# Patient Record
Sex: Male | Born: 1955 | Race: Black or African American | Hispanic: No | Marital: Single | Smoking: Current every day smoker
Health system: Southern US, Community
[De-identification: ages and names within clinical notes are randomized; demographics above are authoritative.]

## PROBLEM LIST (undated history)

## (undated) DIAGNOSIS — Z8719 Personal history of other diseases of the digestive system: Secondary | ICD-10-CM

## (undated) DIAGNOSIS — K7460 Unspecified cirrhosis of liver: Secondary | ICD-10-CM

## (undated) DIAGNOSIS — Z8619 Personal history of other infectious and parasitic diseases: Secondary | ICD-10-CM

## (undated) DIAGNOSIS — F1410 Cocaine abuse, uncomplicated: Secondary | ICD-10-CM

## (undated) DIAGNOSIS — B0229 Other postherpetic nervous system involvement: Secondary | ICD-10-CM

## (undated) DIAGNOSIS — D179 Benign lipomatous neoplasm, unspecified: Secondary | ICD-10-CM

## (undated) DIAGNOSIS — I10 Essential (primary) hypertension: Secondary | ICD-10-CM

## (undated) DIAGNOSIS — F1020 Alcohol dependence, uncomplicated: Secondary | ICD-10-CM

## (undated) DIAGNOSIS — Z21 Asymptomatic human immunodeficiency virus [HIV] infection status: Secondary | ICD-10-CM

## (undated) DIAGNOSIS — K219 Gastro-esophageal reflux disease without esophagitis: Secondary | ICD-10-CM

## (undated) DIAGNOSIS — Z8615 Personal history of latent tuberculosis infection: Secondary | ICD-10-CM

## (undated) DIAGNOSIS — B20 Human immunodeficiency virus [HIV] disease: Secondary | ICD-10-CM

## (undated) HISTORY — PX: OTHER SURGICAL HISTORY: SHX169

---

## 2006-08-28 ENCOUNTER — Emergency Department: Payer: Self-pay | Admitting: Emergency Medicine

## 2012-06-10 ENCOUNTER — Emergency Department: Payer: Self-pay | Admitting: Emergency Medicine

## 2014-06-16 ENCOUNTER — Emergency Department: Payer: Self-pay | Admitting: Emergency Medicine

## 2014-06-16 LAB — URINALYSIS, COMPLETE
Bacteria: NONE SEEN
Bilirubin,UR: NEGATIVE
GLUCOSE, UR: NEGATIVE mg/dL (ref 0–75)
Ketone: NEGATIVE
Leukocyte Esterase: NEGATIVE
NITRITE: NEGATIVE
Ph: 5 (ref 4.5–8.0)
Protein: 30
RBC,UR: 1 /HPF (ref 0–5)
SQUAMOUS EPITHELIAL: NONE SEEN
Specific Gravity: 1.015 (ref 1.003–1.030)

## 2014-06-16 LAB — CBC
HCT: 41.2 % (ref 40.0–52.0)
HGB: 13.6 g/dL (ref 13.0–18.0)
MCH: 31.7 pg (ref 26.0–34.0)
MCHC: 33.1 g/dL (ref 32.0–36.0)
MCV: 96 fL (ref 80–100)
Platelet: 218 10*3/uL (ref 150–440)
RBC: 4.3 10*6/uL — AB (ref 4.40–5.90)
RDW: 13.1 % (ref 11.5–14.5)
WBC: 5.5 10*3/uL (ref 3.8–10.6)

## 2014-06-16 LAB — COMPREHENSIVE METABOLIC PANEL
ALBUMIN: 4 g/dL (ref 3.4–5.0)
ANION GAP: 5 — AB (ref 7–16)
Alkaline Phosphatase: 69 U/L
BUN: 14 mg/dL (ref 7–18)
Bilirubin,Total: 2 mg/dL — ABNORMAL HIGH (ref 0.2–1.0)
CHLORIDE: 103 mmol/L (ref 98–107)
CO2: 29 mmol/L (ref 21–32)
Calcium, Total: 9 mg/dL (ref 8.5–10.1)
Creatinine: 1.41 mg/dL — ABNORMAL HIGH (ref 0.60–1.30)
EGFR (African American): >60
EGFR (Non-African Amer.): 55 — ABNORMAL LOW
GLUCOSE: 88 mg/dL (ref 65–99)
OSMOLALITY: 274 (ref 275–301)
Potassium: 3.7 mmol/L (ref 3.5–5.1)
SGOT(AST): 26 U/L (ref 15–37)
SGPT (ALT): 27 U/L (ref 12–78)
SODIUM: 137 mmol/L (ref 136–145)
Total Protein: 7.8 g/dL (ref 6.4–8.2)

## 2016-08-27 ENCOUNTER — Emergency Department
Admission: EM | Admit: 2016-08-27 | Discharge: 2016-08-27 | Disposition: A | Discharge status: Home / Self Care | Payer: Commercial Managed Care - HMO | Attending: Emergency Medicine | Admitting: Emergency Medicine

## 2016-08-27 ENCOUNTER — Encounter: Payer: Self-pay | Admitting: Emergency Medicine

## 2016-08-27 DIAGNOSIS — T2641XA Burn of right eye and adnexa, part unspecified, initial encounter: Secondary | ICD-10-CM

## 2016-08-27 DIAGNOSIS — X088XXA Exposure to other specified smoke, fire and flames, initial encounter: Secondary | ICD-10-CM | POA: Insufficient documentation

## 2016-08-27 DIAGNOSIS — Y9389 Activity, other specified: Secondary | ICD-10-CM | POA: Insufficient documentation

## 2016-08-27 DIAGNOSIS — Y999 Unspecified external cause status: Secondary | ICD-10-CM | POA: Diagnosis not present

## 2016-08-27 DIAGNOSIS — F17200 Nicotine dependence, unspecified, uncomplicated: Secondary | ICD-10-CM | POA: Insufficient documentation

## 2016-08-27 DIAGNOSIS — T2611XA Burn of cornea and conjunctival sac, right eye, initial encounter: Secondary | ICD-10-CM | POA: Insufficient documentation

## 2016-08-27 DIAGNOSIS — Z21 Asymptomatic human immunodeficiency virus [HIV] infection status: Secondary | ICD-10-CM | POA: Diagnosis not present

## 2016-08-27 DIAGNOSIS — Y929 Unspecified place or not applicable: Secondary | ICD-10-CM | POA: Diagnosis not present

## 2016-08-27 DIAGNOSIS — H5711 Ocular pain, right eye: Secondary | ICD-10-CM | POA: Diagnosis present

## 2016-08-27 DIAGNOSIS — I10 Essential (primary) hypertension: Secondary | ICD-10-CM | POA: Diagnosis not present

## 2016-08-27 HISTORY — DX: Asymptomatic human immunodeficiency virus (hiv) infection status: Z21

## 2016-08-27 HISTORY — DX: Essential (primary) hypertension: I10

## 2016-08-27 HISTORY — DX: Human immunodeficiency virus (HIV) disease: B20

## 2016-08-27 MED ORDER — FLUORESCEIN SODIUM 1 MG OP STRP
1.0000 | ORAL_STRIP | Freq: Once | OPHTHALMIC | Status: AC
  Administered 2016-08-27: 1 via OPHTHALMIC
  Filled 2016-08-27: qty 1

## 2016-08-27 MED ORDER — TETRACAINE HCL 0.5 % OP SOLN
2.0000 [drp] | Freq: Once | OPHTHALMIC | Status: AC
  Administered 2016-08-27: 2 [drp] via OPHTHALMIC
  Filled 2016-08-27: qty 2

## 2016-08-27 MED ORDER — KETOROLAC TROMETHAMINE 0.5 % OP SOLN: 1.0000 [drp] | Freq: Four times a day (QID) | OPHTHALMIC | 0 refills | Status: DC
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
## 2018-03-29 HISTORY — PX: COLONOSCOPY: SHX174

## 2018-06-12 ENCOUNTER — Emergency Department: Payer: Medicare HMO

## 2018-06-12 ENCOUNTER — Emergency Department: Payer: Medicare HMO | Admitting: Certified Registered"

## 2018-06-12 ENCOUNTER — Encounter
Admission: EM | Disposition: A | Discharge status: Home / Self Care | Payer: Self-pay | Source: Home / Self Care | Attending: Emergency Medicine

## 2018-06-12 ENCOUNTER — Other Ambulatory Visit: Payer: Self-pay

## 2018-06-12 ENCOUNTER — Emergency Department
Admission: EM | Admit: 2018-06-12 | Discharge: 2018-06-12 | Disposition: A | Discharge status: Home / Self Care | Payer: Medicare HMO | Attending: Gastroenterology | Admitting: Gastroenterology

## 2018-06-12 ENCOUNTER — Encounter: Payer: Self-pay | Admitting: *Deleted

## 2018-06-12 DIAGNOSIS — Z8719 Personal history of other diseases of the digestive system: Secondary | ICD-10-CM | POA: Insufficient documentation

## 2018-06-12 DIAGNOSIS — I10 Essential (primary) hypertension: Secondary | ICD-10-CM | POA: Insufficient documentation

## 2018-06-12 DIAGNOSIS — F17200 Nicotine dependence, unspecified, uncomplicated: Secondary | ICD-10-CM | POA: Diagnosis not present

## 2018-06-12 DIAGNOSIS — T18108A Unspecified foreign body in esophagus causing other injury, initial encounter: Secondary | ICD-10-CM | POA: Diagnosis not present

## 2018-06-12 DIAGNOSIS — Z79899 Other long term (current) drug therapy: Secondary | ICD-10-CM | POA: Diagnosis not present

## 2018-06-12 DIAGNOSIS — T18128A Food in esophagus causing other injury, initial encounter: Secondary | ICD-10-CM | POA: Diagnosis not present

## 2018-06-12 DIAGNOSIS — X58XXXA Exposure to other specified factors, initial encounter: Secondary | ICD-10-CM | POA: Insufficient documentation

## 2018-06-12 DIAGNOSIS — K209 Esophagitis, unspecified: Secondary | ICD-10-CM | POA: Diagnosis not present

## 2018-06-12 DIAGNOSIS — Z21 Asymptomatic human immunodeficiency virus [HIV] infection status: Secondary | ICD-10-CM | POA: Diagnosis not present

## 2018-06-12 HISTORY — PX: FOREIGN BODY REMOVAL: SHX962

## 2018-06-12 LAB — BASIC METABOLIC PANEL
ANION GAP: 15 (ref 5–15)
BUN: 15 mg/dL (ref 6–20)
CHLORIDE: 102 mmol/L (ref 101–111)
CO2: 24 mmol/L (ref 22–32)
Calcium: 9.3 mg/dL (ref 8.9–10.3)
Creatinine, Ser: 1.42 mg/dL — ABNORMAL HIGH (ref 0.61–1.24)
GFR calc Af Amer: >60 mL/min (ref >60)
GFR, EST NON AFRICAN AMERICAN: 52 mL/min — AB (ref >60)
Glucose, Bld: 102 mg/dL — ABNORMAL HIGH (ref 65–99)
POTASSIUM: 3.8 mmol/L (ref 3.5–5.1)
Sodium: 141 mmol/L (ref 135–145)

## 2018-06-12 LAB — CBC WITH DIFFERENTIAL/PLATELET
BASOS ABS: 0.1 10*3/uL (ref 0–0.1)
Basophils Relative: 1 %
Eosinophils Absolute: 0.1 10*3/uL (ref 0–0.7)
Eosinophils Relative: 1 %
HEMATOCRIT: 41.6 % (ref 40.0–52.0)
HEMOGLOBIN: 14.3 g/dL (ref 13.0–18.0)
LYMPHS ABS: 2.3 10*3/uL (ref 1.0–3.6)
LYMPHS PCT: 24 %
MCH: 31.5 pg (ref 26.0–34.0)
MCHC: 34.4 g/dL (ref 32.0–36.0)
MCV: 91.6 fL (ref 80.0–100.0)
Monocytes Absolute: 0.8 10*3/uL (ref 0.2–1.0)
Monocytes Relative: 8 %
NEUTROS ABS: 6.3 10*3/uL (ref 1.4–6.5)
Neutrophils Relative %: 66 %
Platelets: 301 10*3/uL (ref 150–440)
RBC: 4.55 MIL/uL (ref 4.40–5.90)
RDW: 14.1 % (ref 11.5–14.5)
WBC: 9.4 10*3/uL (ref 3.8–10.6)

## 2018-06-12 SURGERY — REMOVAL, FOREIGN BODY
Anesthesia: General

## 2018-06-12 MED ORDER — SUCCINYLCHOLINE CHLORIDE 20 MG/ML IJ SOLN
INTRAMUSCULAR | Status: AC
  Filled 2018-06-12: qty 1

## 2018-06-12 MED ORDER — FENTANYL CITRATE (PF) 100 MCG/2ML IJ SOLN
INTRAMUSCULAR | Status: AC
  Filled 2018-06-12: qty 2

## 2018-06-12 MED ORDER — PHENYLEPHRINE HCL 10 MG/ML IJ SOLN
INTRAMUSCULAR | Status: DC | PRN
  Administered 2018-06-12 (×3): 200 ug via INTRAVENOUS

## 2018-06-12 MED ORDER — ONDANSETRON HCL 4 MG/2ML IJ SOLN
4.0000 mg | INTRAMUSCULAR | Status: AC
  Administered 2018-06-12: 4 mg via INTRAVENOUS

## 2018-06-12 MED ORDER — FENTANYL CITRATE (PF) 100 MCG/2ML IJ SOLN
INTRAMUSCULAR | Status: DC | PRN
  Administered 2018-06-12 (×2): 50 ug via INTRAVENOUS

## 2018-06-12 MED ORDER — GLUCAGON HCL RDNA (DIAGNOSTIC) 1 MG IJ SOLR
INTRAMUSCULAR | Status: DC | PRN
  Administered 2018-06-12: 1 mg via INTRAVENOUS

## 2018-06-12 MED ORDER — GI COCKTAIL ~~LOC~~
30.0000 mL | Freq: Once | ORAL | Status: AC
  Administered 2018-06-12: 30 mL via ORAL

## 2018-06-12 MED ORDER — LIDOCAINE HCL (PF) 2 % IJ SOLN
INTRAMUSCULAR | Status: AC
  Filled 2018-06-12: qty 10

## 2018-06-12 MED ORDER — ONDANSETRON HCL 4 MG/2ML IJ SOLN
INTRAMUSCULAR | Status: AC
  Filled 2018-06-12: qty 2

## 2018-06-12 MED ORDER — GLYCOPYRROLATE 0.2 MG/ML IJ SOLN
INTRAMUSCULAR | Status: DC | PRN
  Administered 2018-06-12: 0.2 mg via INTRAVENOUS

## 2018-06-12 MED ORDER — DEXAMETHASONE SODIUM PHOSPHATE 10 MG/ML IJ SOLN
INTRAMUSCULAR | Status: AC
  Filled 2018-06-12: qty 1

## 2018-06-12 MED ORDER — MIDAZOLAM HCL 5 MG/5ML IJ SOLN
INTRAMUSCULAR | Status: DC | PRN
  Administered 2018-06-12: 2 mg via INTRAVENOUS

## 2018-06-12 MED ORDER — ONDANSETRON HCL 4 MG/2ML IJ SOLN: 4.0000 mg | Freq: Once | INTRAMUSCULAR | Status: DC | PRN

## 2018-06-12 MED ORDER — MIDAZOLAM HCL 2 MG/2ML IJ SOLN
INTRAMUSCULAR | Status: AC
  Filled 2018-06-12: qty 2

## 2018-06-12 MED ORDER — GLUCAGON HCL RDNA (DIAGNOSTIC) 1 MG IJ SOLR
INTRAMUSCULAR | Status: AC
  Filled 2018-06-12: qty 1

## 2018-06-12 MED ORDER — SUCCINYLCHOLINE CHLORIDE 20 MG/ML IJ SOLN
INTRAMUSCULAR | Status: DC | PRN
  Administered 2018-06-12: 160 mg via INTRAVENOUS

## 2018-06-12 MED ORDER — ROCURONIUM BROMIDE 100 MG/10ML IV SOLN
INTRAVENOUS | Status: DC | PRN
  Administered 2018-06-12: 5 mg via INTRAVENOUS

## 2018-06-12 MED ORDER — SODIUM CHLORIDE 0.9 % IV SOLN
INTRAVENOUS | Status: DC | PRN
  Administered 2018-06-12 (×2): via INTRAVENOUS

## 2018-06-12 MED ORDER — PROPOFOL 10 MG/ML IV BOLUS
INTRAVENOUS | Status: DC | PRN
  Administered 2018-06-12: 180 mg via INTRAVENOUS
  Administered 2018-06-12: 120 mg via INTRAVENOUS

## 2018-06-12 MED ORDER — DEXAMETHASONE SODIUM PHOSPHATE 10 MG/ML IJ SOLN
INTRAMUSCULAR | Status: DC | PRN
  Administered 2018-06-12: 10 mg via INTRAVENOUS

## 2018-06-12 MED ORDER — PROPOFOL 10 MG/ML IV BOLUS
INTRAVENOUS | Status: AC
  Filled 2018-06-12: qty 20

## 2018-06-12 MED ORDER — ROCURONIUM BROMIDE 50 MG/5ML IV SOLN
INTRAVENOUS | Status: AC
  Filled 2018-06-12: qty 1

## 2018-06-12 MED ORDER — GI COCKTAIL ~~LOC~~
ORAL | Status: AC
  Filled 2018-06-12: qty 30

## 2018-06-12 MED ORDER — GLYCOPYRROLATE 0.2 MG/ML IJ SOLN
INTRAMUSCULAR | Status: AC
  Filled 2018-06-12: qty 1

## 2018-06-12 MED ORDER — ONDANSETRON HCL 4 MG/2ML IJ SOLN
INTRAMUSCULAR | Status: DC | PRN
  Administered 2018-06-12: 4 mg via INTRAVENOUS

## 2018-06-12 MED ORDER — LIDOCAINE HCL (CARDIAC) PF 100 MG/5ML IV SOSY
PREFILLED_SYRINGE | INTRAVENOUS | Status: DC | PRN
  Administered 2018-06-12: 60 mg via INTRATRACHEAL

## 2018-06-12 MED ORDER — FENTANYL CITRATE (PF) 100 MCG/2ML IJ SOLN: 25.0000 ug | INTRAMUSCULAR | Status: DC | PRN

## 2018-06-12 MED ORDER — SODIUM CHLORIDE 0.9 % IV BOLUS
1000.0000 mL | INTRAVENOUS | Status: AC
Start: 2018-06-12 — End: 2018-06-12
  Administered 2018-06-12: 1000 mL via INTRAVENOUS

## 2018-06-12 NOTE — Anesthesia Postprocedure Evaluation (Signed)
Anesthesia Post Note  Patient: Richard Rowe  Procedure(s) Performed: FOREIGN BODY REMOVAL (N/A )  Anesthesia Type: General     Last Vitals:  Vitals:   06/12/18 0808 06/12/18 0809  BP: (!) 152/86   Pulse: (!) 109 (!) 105  Resp: (!) 21 20  Temp: 36.6 C   SpO2: 99% 97%    Last Pain:  Vitals:   06/12/18 0809  TempSrc: Tympanic  PainSc:                  Sephira Zellman L Martin Smeal

## 2018-06-12 NOTE — Transfer of Care (Signed)
Immediate Anesthesia Transfer of Care Note  Patient: Richard Rowe  Procedure(s) Performed: FOREIGN BODY REMOVAL (N/A )  Patient Location: PACU  Anesthesia Type:General  Level of Consciousness: awake, alert  and oriented  Airway & Oxygen Therapy: Patient Spontanous Breathing and Patient connected to nasal cannula oxygen  Post-op Assessment: Report given to RN, Post -op Vital signs reviewed and stable and Patient moving all extremities  Post vital signs: Reviewed and stable  Last Vitals:  Vitals Value Taken Time  BP 152/86 06/12/2018  8:08 AM  Temp 36.6 C 06/12/2018  8:08 AM  Pulse 107 06/12/2018  8:09 AM  Resp 21 06/12/2018  8:09 AM  SpO2 96 % 06/12/2018  8:09 AM  Vitals shown include unvalidated device data.  Last Pain:  Vitals:   06/12/18 0809  TempSrc: Tympanic  PainSc:          Complications: No apparent anesthesia complications

## 2018-06-12 NOTE — H&P (Signed)
[name], MD [address] [place], [place], [place], [address] [address], [place], [place], [place], [address] Phone: [phone]  Fax: [phone]  Primary Care Physician:  Patient, No Pcp Per   Pre-Procedure History & Physical: HPI:  Richard Rowe is a 62 y.o. male is here for an endoscopy    Past Medical History:  Diagnosis Date  . HIV (human immunodeficiency virus infection) ([place])   . Hypertension     Past Surgical History:  Procedure Laterality Date  . rectal polyps      Prior to Admission medications   Medication Sig Start Date End Date Taking? Authorizing Provider  BIKTARVY 50-200-25 MG TABS tablet Take 1 tablet by mouth daily.   Yes [provider]  enalapril (VASOTEC) 20 MG tablet Take 20 mg by mouth daily.   Yes [provider]  gabapentin (NEURONTIN) 600 MG tablet Take 600 mg by mouth 3 (three) times daily.   Yes [provider]  ketorolac (ACULAR) 0.5 % ophthalmic solution Place 1 drop into the right eye 4 (four) times daily. Patient not taking: Reported on 06/12/2018 08/27/16   Cuthriell, Charline Bills, PA-C    Allergies as of 06/12/2018  . (No Known Allergies)    History reviewed. No pertinent family history.  Social History   Socioeconomic History  . Marital status: Single    Spouse name: Not on file  . Number of children: Not on file  . Years of education: Not on file  . Highest education level: Not on file  Occupational History  . Not on file  Social Needs  . Financial resource strain: Not on file  . Food insecurity:    Worry: Not on file    Inability: Not on file  . Transportation needs:    Medical: Not on file    Non-medical: Not on file  Tobacco Use  . Smoking status: Current Some Day Smoker  . Smokeless tobacco: Never Used  Substance and Sexual Activity  . Alcohol use: Yes  . Drug use: Yes    Types: Cocaine    Comment: smoked crack yesterday per pt report  . Sexual activity: Not on file    Lifestyle  . Physical activity:    Days per week: Not on file    Minutes per session: Not on file  . Stress: Not on file  Relationships  . Social connections:    Talks on phone: Not on file    Gets together: Not on file    Attends religious service: Not on file    Active member of club or organization: Not on file    Attends meetings of clubs or organizations: Not on file    Relationship status: Not on file  . Intimate partner violence:    Fear of current or ex partner: Not on file    Emotionally abused: Not on file    Physically abused: Not on file    Forced sexual activity: Not on file  Other Topics Concern  . Not on file  Social History Narrative  . Not on file    Review of Systems: See HPI, otherwise negative ROS  Physical Exam: BP (!) 166/88 (BP Location: Right Arm)   Pulse 87   Temp 98.3 F (36.8 C) (Oral)   Resp 20   Ht 5\' 6"  (1.676 m)   Wt 205 lb (93 kg)   SpO2 99%   BMI 33.09 kg/m  General:   Alert,  pleasant and  cooperative in NAD Head:  Normocephalic and atraumatic. Neck:  Supple; no masses or thyromegaly. Lungs:  Clear throughout to auscultation, normal respiratory effort.    Heart:  +S1, +S2, Regular rate and rhythm, No edema. Abdomen:  Soft, nontender and nondistended. Normal bowel sounds, without guarding, and without rebound.   Neurologic:  Alert and  oriented x4;  grossly normal neurologically.  Impression/Plan: Richard Rowe is here for an endoscopy  to be performed for  evaluation of foreign body in esophagus    Risks, benefits, limitations, and alternatives regarding endoscopy have been reviewed with the patient.  Questions have been answered.  All parties agreeable.   [name], MD  06/12/2018, 7:07 AM

## 2018-06-12 NOTE — ED Triage Notes (Signed)
Pt arrives via EMS from home. He was eating ribs around 2330 and feels like it is caught in his throat, reports when he drinks water it does not go down, starts choking him and comes back up. He is handling his secretions, occasional cough. He is sticking his finger down his throat while in triage causing him to dry heave.  States that he has had this happen before, but when he got to the hospital he was able to vomit it back up. En eroute, pressure 173/107, saturations 100%.

## 2018-06-12 NOTE — ED Provider Notes (Signed)
[facility] Emergency Department Provider Note  ____________________________________________   First MD Initiated Contact with Patient 06/12/18 0250     (approximate)  I have reviewed the triage vital signs and the nursing notes.   HISTORY  Chief Complaint Foreign Body    HPI Richard Rowe is a 62 y.o. male with PMH as listed below who presents for evaluation of several hours of esophageal foreign body.  Reports he was eating ribs tonight when he became choked on a piece of meat, fat, or bone.  He was not able to cough or vomit it out.  Had a similar episode occur years ago which he was able to vomit back up, but his symptoms have been constant for several hours.  He is sitting up, breathing comfortably,  but cannot swallow his secretions, and is repeatedly vomiting and spitting into an emesis bag.  Reports symptoms were acute in onset and severe.  Nothing makes better nor worse.  Denies chest pain, SOB, abdominal pain, dysuria.  Denies fever/chills. Minimal neck/throat pain, but highly uncomfortable. Reports hx of HIV, but reports compliance with anti-retrovirals, and reports his last CD4 count was "good".  Past Medical History:  Diagnosis Date  . HIV (human immunodeficiency virus infection) ([place])   . Hypertension     There are no active problems to display for this patient.   Past Surgical History:  Procedure Laterality Date  . rectal polyps      Prior to Admission medications   Medication Sig Start Date End Date Taking? Authorizing Provider  BIKTARVY 50-200-25 MG TABS tablet Take 1 tablet by mouth daily.   Yes [provider]  enalapril (VASOTEC) 20 MG tablet Take 20 mg by mouth daily.   Yes [provider]  gabapentin (NEURONTIN) 600 MG tablet Take 600 mg by mouth 3 (three) times daily.   Yes [provider]  ketorolac (ACULAR) 0.5 % ophthalmic solution Place 1 drop into the right eye 4 (four) times  daily. Patient not taking: Reported on 06/12/2018 08/27/16   Cuthriell, Charline Bills, PA-C    Allergies Patient has no known allergies.  History reviewed. No pertinent family history.  Social History Social History   Tobacco Use  . Smoking status: Current Some Day Smoker  . Smokeless tobacco: Never Used  Substance Use Topics  . Alcohol use: Yes  . Drug use: Yes    Types: Cocaine    Comment: smoked crack yesterday per pt report    Review of Systems Constitutional: No fever/chills Eyes: No visual changes. ENT: Foreign body sensation in throat, unable to handle secretions Cardiovascular: Denies chest pain. Respiratory: Denies shortness of breath. Gastrointestinal: No abdominal pain.  No nausea, no vomiting.  No diarrhea.  No constipation. Genitourinary: Negative for dysuria. Musculoskeletal: Negative for back pain. Integumentary: Negative for rash. Neurological: Negative for headaches, focal weakness or numbness.   ____________________________________________   PHYSICAL EXAM:  VITAL SIGNS: ED Triage Vitals  Enc Vitals Group     BP 06/12/18 0057 (!) 158/86     Pulse Rate 06/12/18 0057 82     Resp 06/12/18 0057 20     Temp 06/12/18 0057 98.8 F (37.1 C)     Temp Source 06/12/18 0057 Oral     SpO2 06/12/18 0057 98 %     Weight 06/12/18 0057 93 kg (205 lb)     Height 06/12/18 0057 1.676 m (5\' 6" )     Head Circumference --      Peak Flow --  Pain Score 06/12/18 0056 2     Pain Loc --      Pain Edu? --      Excl. in [place]? --     Constitutional: Alert and oriented. Appears acutely uncomfortable and is sitting up in bed and leaning forward with an emesis bag. Eyes: Conjunctivae are normal.  Head: Atraumatic. Nose: No congestion/rhinnorhea. Mouth/Throat: Mucous membranes are moist.  Oropharynx non-erythematous. Neck: No stridor.  Not handling secretions. Normal voice. Cardiovascular: Normal rate, regular rhythm. Good peripheral circulation. Grossly normal heart  sounds. Respiratory: Normal respiratory effort.  No retractions. Lungs CTAB. Gastrointestinal: Soft and nontender. No distention.  Frequent retching/vomiting Musculoskeletal: No lower extremity tenderness nor edema. No gross deformities of extremities. Neurologic:  Normal speech and language. No gross focal neurologic deficits are appreciated.  Skin:  Skin is warm, dry and intact. No rash noted. Psychiatric: Mood and affect are normal. Speech and behavior are normal.  ____________________________________________   LABS (all labs ordered are listed, but only abnormal results are displayed)  Labs Reviewed  BASIC METABOLIC PANEL - Abnormal; Notable for the following components:      Result Value   Glucose, Bld 102 (*)    Creatinine, Ser 1.42 (*)    GFR calc non Af Amer 52 (*)    All other components within normal limits  CBC WITH DIFFERENTIAL/PLATELET   ____________________________________________  EKG  None - EKG not ordered by ED physician patient  ____________________________________________  RADIOLOGY Ursula Alert, personally viewed and evaluated these images (plain radiographs) as part of my medical decision making, as well as reviewing the written report by the radiologist.  ED MD interpretation: Probable foreign body in the esophagus  Official radiology report(s): Dg Neck Soft Tissue  Result Date: 06/12/2018 CLINICAL DATA:  Foreign body sensation after eating ribs. EXAM: NECK SOFT TISSUES - 1+ VIEW COMPARISON:  None. FINDINGS: Probable calcified focus behind the cricoid cartilage at the level of C4-C5 suspicious for bone fragment. No soft tissue air to suggest perforation. No prevertebral soft tissue edema. The epiglottis is normal. No prevertebral soft tissue edema. Multiple missing teeth. IMPRESSION: Findings suspicious for foreign bone fragment in the esophagus at the level of C4-C5. Electronically Signed   By: Jeb Levering M.D.   On: 06/12/2018 02:14     ____________________________________________   PROCEDURES  Critical Care performed: No   Procedure(s) performed:   Procedures   ____________________________________________   INITIAL IMPRESSION / ASSESSMENT AND PLAN / ED COURSE  As part of my medical decision making, I reviewed the following data within the [place] notes reviewed and incorporated, Labs reviewed , Radiograph reviewed  and A consult was requested and obtained from this/these consultant(s) Gastroenterology    Differential diagnosis includes, but is not limited to, esophageal body (either bone or meat), esophageal perforation, abscess/infection.  Based on the history of present illness and the patient's current presentation I strongly feel he has an esophageal foreign body.  His airway is intact and well protected, but he is not able to handle his own secretions.  I had him sit up a little bit of cartilage which he admitted back up.  I also had him try a little bit of a GI cocktail to see if that would tooth and anesthetize his throat, but he also vomited that back up.  I will call gastroenterology for urgent endoscopy and foreign body removal.  Also giving Zofran 4 mg IV.  Clinical Course as of Jun 12 937  Sat  Jun 12, 2018  0301 Spoke with Dr. Vicente Males, discussed the case.  He requested that I call ENT because he felt that this may be more appropriate given the level (C4-C5).   [CF]  [phone] Spoke with Dr. Tami Ribas who felt that given that the bone is in the esophagus, it is more appropriate for GI to handle.  I called Dr. Vicente Males back and he said that he would notify the OR and they would take the patient when possible.   [CF]  9604 Generally reassuring labs.  Still awaiting OR time.   [CF]  5409 Given the persistent vomiting, inability to take oral fluids, and the delay in going to the operating room, I have ordered a 1 L normal saline fluid bolus.   [CF]  8119 No word back from the OR.  Tried  to call Dr. Vicente Males on his mobile phone but no answer.  Left a voicemail.   [CF]  [phone] Dr. Vicente Males called back, let me know that there had been some sort of miscommunication, but he was on his way in, hopefully by 7am.   [CF]    Clinical Course User Index [CF] Hinda Kehr, MD    ____________________________________________  FINAL CLINICAL IMPRESSION(S) / ED DIAGNOSES  Final diagnoses:  Esophageal foreign body, initial encounter     MEDICATIONS GIVEN DURING THIS VISIT:  Medications  fentaNYL (SUBLIMAZE) injection 25 mcg (has no administration in time range)  ondansetron (ZOFRAN) injection 4 mg (has no administration in time range)  gi cocktail (Maalox,Lidocaine,Donnatal) (30 mLs Oral Given 06/12/18 0252)  ondansetron (ZOFRAN) injection 4 mg (4 mg Intravenous Given 06/12/18 0439)  sodium chloride 0.9 % bolus 1,000 mL (1,000 mLs Intravenous New Bag/Given 06/12/18 0545)  ondansetron (ZOFRAN) injection 4 mg (4 mg Intravenous Given 06/12/18 2956)         Note:  This document was prepared using Dragon voice recognition software and may include unintentional dictation errors.    Hinda Kehr, MD 06/12/18 [phone]

## 2018-06-12 NOTE — Anesthesia Procedure Notes (Signed)
Procedure Name: Intubation Date/Time: 06/12/2018 7:15 AM Performed by: Sherrine Maples, CRNA Pre-anesthesia Checklist: Patient identified, Emergency Drugs available, Suction available, Patient being monitored and Timeout performed Patient Re-evaluated:Patient Re-evaluated prior to induction Oxygen Delivery Method: Circle system utilized Preoxygenation: Pre-oxygenation with 100% oxygen Induction Type: IV induction, Rapid sequence and Cricoid Pressure applied Ventilation: Mask ventilation without difficulty Laryngoscope Size: Mac and 3 Grade View: Grade I Tube type: Oral Tube size: 7.0 mm Number of attempts: 1 Airway Equipment and Method: Stylet and Video-laryngoscopy Placement Confirmation: ETT inserted through vocal cords under direct vision,  positive ETCO2 and breath sounds checked- equal and bilateral Secured at: 22 cm Tube secured with: Tape Dental Injury: Teeth and Oropharynx as per pre-operative assessment

## 2018-06-12 NOTE — Anesthesia Post-op Follow-up Note (Signed)
Anesthesia QCDR form completed.        

## 2018-06-12 NOTE — Progress Notes (Signed)
Marsha  RN from endo called patients sister To come and pick him up for discharge.

## 2018-06-12 NOTE — Anesthesia Preprocedure Evaluation (Signed)
Anesthesia Evaluation  Patient identified by MRN, date of birth, ID band Patient awake    Reviewed: Allergy & Precautions, H&P , NPO status , Patient's Chart, lab work & pertinent test results, reviewed documented beta blocker date and time   Airway Mallampati: II   Neck ROM: full    Dental  (+) Poor Dentition   Pulmonary neg pulmonary ROS, Current Smoker,    Pulmonary exam normal        Cardiovascular Exercise Tolerance: Good hypertension, On Medications negative cardio ROS Normal cardiovascular exam Rhythm:regular Rate:Normal     Neuro/Psych negative neurological ROS  negative psych ROS   GI/Hepatic negative GI ROS, Neg liver ROS,   Endo/Other  negative endocrine ROS  Renal/GU negative Renal ROS  negative genitourinary   Musculoskeletal   Abdominal   Peds  Hematology negative hematology ROS (+)   Anesthesia Other Findings Past Medical History: No date: HIV (human immunodeficiency virus infection) ([facility]) No date: Hypertension Past Surgical History: No date: rectal polyps BMI    Body Mass Index:  33.09 kg/m     Reproductive/Obstetrics negative OB ROS                             Anesthesia Physical Anesthesia Plan  ASA: III and emergent  Anesthesia Plan: General   Post-op Pain Management:    Induction:   PONV Risk Score and Plan:   Airway Management Planned:   Additional Equipment:   Intra-op Plan:   Post-operative Plan:   Informed Consent: I have reviewed the patients History and Physical, chart, labs and discussed the procedure including the risks, benefits and alternatives for the proposed anesthesia with the patient or authorized representative who has indicated his/her understanding and acceptance.   Dental Advisory Given  Plan Discussed with: CRNA  Anesthesia Plan Comments:         Anesthesia Quick Evaluation

## 2018-06-12 NOTE — Consult Note (Signed)
Richard Rowe , MD [address], [place], [place], [place], [address] [address], [place], [place], [place], [address] Phone: [phone]  Fax: [phone]  Consultation  Referring Provider:   Dr Karma Greaser Primary Care Physician:  Patient, No Pcp Per Primary Gastroenterologist:  None         Reason for Consultation:     Foreign body   Date of Admission:  06/12/2018 Date of Consultation:  06/12/2018         HPI:   Richard Rowe is a 62 y.o. male with a history of HIV, at 11.30 pm was eating beef rib and thought a piece of bone was stuck in his throat. Can still feel it lodged. Similar issue many years back. Not on any blood thinners.   X ray neck shows at level of C4-C5, I discussed with ENT Dr Tami Ribas if it would warrant ENT to try as it is is higher up than the origin of the UES, he suggested we try and see if possible to take out.   Past Medical History:  Diagnosis Date  . HIV (human immunodeficiency virus infection) ([place])   . Hypertension     Past Surgical History:  Procedure Laterality Date  . rectal polyps      Prior to Admission medications   Medication Sig Start Date End Date Taking? Authorizing Provider  BIKTARVY 50-200-25 MG TABS tablet Take 1 tablet by mouth daily.   Yes [provider]  enalapril (VASOTEC) 20 MG tablet Take 20 mg by mouth daily.   Yes [provider]  gabapentin (NEURONTIN) 600 MG tablet Take 600 mg by mouth 3 (three) times daily.   Yes [provider]  ketorolac (ACULAR) 0.5 % ophthalmic solution Place 1 drop into the right eye 4 (four) times daily. Patient not taking: Reported on 06/12/2018 08/27/16   Cuthriell, Charline Bills, PA-C    History reviewed. No pertinent family history.   Social History   Tobacco Use  . Smoking status: Current Some Day Smoker  . Smokeless tobacco: Never Used  Substance Use Topics  . Alcohol use: Yes  . Drug use: Yes    Types: Cocaine    Comment: smoked crack yesterday per pt  report    Allergies as of 06/12/2018  . (No Known Allergies)    Review of Systems:    All systems reviewed and negative except where noted in HPI.   Physical Exam:  Vital signs in last 24 hours: Temp:  [98.3 F (36.8 C)-98.8 F (37.1 C)] 98.3 F (36.8 C) (06/15 0650) Pulse Rate:  [82-87] 87 (06/15 0650) Resp:  [20] 20 (06/15 0650) BP: (158-166)/(86-88) 166/88 (06/15 0650) SpO2:  [98 %-99 %] 99 % (06/15 0650) Weight:  [205 lb (93 kg)] 205 lb (93 kg) (06/15 0057)   General:   Pleasant, cooperative in NAD Head:  Normocephalic and atraumatic. Eyes:   No icterus.   Conjunctiva pink. PERRLA. Ears:  Normal auditory acuity. Neck:  Supple; no masses or thyroidomegaly Lungs: Respirations even and unlabored. Lungs clear to auscultation bilaterally.   No wheezes, crackles, or rhonchi.  Heart:  Regular rate and rhythm;  Without murmur, clicks, rubs or gallops Abdomen:  Soft, nondistended, nontender. Normal bowel sounds. No appreciable masses or hepatomegaly.  No rebound or guarding.  Neurologic:  Alert and oriented x3;  grossly normal neurologically. Skin:  Intact without significant lesions or rashes. Cervical Nodes:  No significant cervical adenopathy. Psych:  Alert and cooperative. Normal affect.  LAB  RESULTS: Recent Labs    06/12/18 0344  WBC 9.4  HGB 14.3  HCT 41.6  PLT 301   BMET Recent Labs    06/12/18 0438  NA 141  K 3.8  CL 102  CO2 24  GLUCOSE 102*  BUN 15  CREATININE 1.42*  CALCIUM 9.3   LFT No results for input(s): PROT, ALBUMIN, AST, ALT, ALKPHOS, BILITOT, BILIDIR, IBILI in the last 72 hours. PT/INR No results for input(s): LABPROT, INR in the last 72 hours.  STUDIES: Dg Neck Soft Tissue  Result Date: 06/12/2018 CLINICAL DATA:  Foreign body sensation after eating ribs. EXAM: NECK SOFT TISSUES - 1+ VIEW COMPARISON:  None. FINDINGS: Probable calcified focus behind the cricoid cartilage at the level of C4-C5 suspicious for bone fragment. No soft tissue  air to suggest perforation. No prevertebral soft tissue edema. The epiglottis is normal. No prevertebral soft tissue edema. Multiple missing teeth. IMPRESSION: Findings suspicious for foreign bone fragment in the esophagus at the level of C4-C5. Electronically Signed   By: Jeb Levering M.D.   On: 06/12/2018 02:14      Impression / Plan:   Richard Rowe is a 62 y.o. y/o male with foreign body in esophagus.   Plan   1. EGD  I have discussed alternative options, risks & benefits,  which include, but are not limited to, bleeding, infection, perforation,respiratory complication & drug reaction.  The patient agrees with this plan & written consent will be obtained.      Thank you for involving me in the care of this patient.      LOS: 0 days   Richard Bellows, MD  06/12/2018, 7:08 AM

## 2018-06-12 NOTE — Anesthesia Postprocedure Evaluation (Signed)
Anesthesia Post Note  Patient: Richard Rowe  Procedure(s) Performed: FOREIGN BODY REMOVAL (N/A )  Patient location during evaluation: PACU Anesthesia Type: General Level of consciousness: awake Pain management: pain level controlled Vital Signs Assessment: post-procedure vital signs reviewed and stable Respiratory status: spontaneous breathing, nonlabored ventilation and patient connected to nasal cannula oxygen Cardiovascular status: blood pressure returned to baseline and stable Postop Assessment: no headache     Last Vitals:  Vitals:   06/12/18 0808 06/12/18 0809  BP: (!) 152/86   Pulse: (!) 109 (!) 105  Resp: (!) 21 20  Temp: 36.6 C   SpO2: 99% 97%    Last Pain:  Vitals:   06/12/18 0809  TempSrc: Tympanic  PainSc:                  [name]

## 2018-06-12 NOTE — Anesthesia Postprocedure Evaluation (Signed)
Anesthesia Post Note  Patient: Richard Rowe  Procedure(s) Performed: FOREIGN BODY REMOVAL (N/A )  Anesthesia Type: General     Last Vitals:  Vitals:   06/12/18 0808 06/12/18 0809  BP: (!) 152/86   Pulse: (!) 109 (!) 105  Resp: (!) 21 20  Temp: 36.6 C   SpO2: 99% 97%    Last Pain:  Vitals:   06/12/18 0809  TempSrc: Tympanic  PainSc:                  Ruba Outen L Rose Hippler

## 2018-06-14 NOTE — Op Note (Addendum)
[facility] Patient Name: Richard Rowe Procedure Date: 06/12/2018 7:12 AM MRN: [id number] Account #: [id number] Date of Birth: [birth year] Admit Type: Outpatient Age: 62 Room: [facility] ENDO ROOM 1 Note Status: Finalized Procedure:            Upper GI endoscopy Indications:          Foreign body in the esophagus Providers:            [name] MD, MD Medicines:            Monitored Anesthesia Care Complications:        No immediate complications. Procedure:            Pre-Anesthesia Assessment:                       - Prior to the procedure, a History and Physical was                        performed, and patient medications, allergies and                        sensitivities were reviewed. The patient's tolerance of                        previous anesthesia was reviewed.                       - The risks and benefits of the procedure and the                        sedation options and risks were discussed with the                        patient. All questions were answered and informed                        consent was obtained.                       - ASA Grade Assessment: III - A patient with severe                        systemic disease.                       After obtaining informed consent, the endoscope was                        passed under direct vision. Throughout the procedure,                        the patient's blood pressure, pulse, and oxygen                        saturations were monitored continuously. The Endoscope                        was introduced through the mouth, and advanced to the                        third part of duodenum. The upper GI endoscopy was  somewhat difficult. Findings:      The examined duodenum was normal.      The stomach was normal.      Food was found in the lower third of the esophagus. Removal of food was       accomplished. Large piece of meat- taken out in pieces     LA Grade B (one or more mucosal breaks greater than 5 mm, not extending       between the tops of two mucosal folds) esophagitis with no bleeding was       found at the gastroesophageal junction.      The cardia and gastric fundus were normal on retroflexion. Impression:           - Normal examined duodenum.                       - Normal stomach.                       - Food in the lower third of the esophagus. Removal was                        successful.                       - LA Grade B esophagitis. Recommendation:       - Discharge patient to home (with escort).                       - Advance diet as tolerated.                       - Continue present medications.                       - Use Zantac (ranitidine) 150 mg PO BID for 6 weeks.                       - Return to my office in 6 weeks. Procedure Code(s):    --- Professional ---                       [phone], Esophagogastroduodenoscopy, flexible, transoral;                        with removal of foreign body(s) Diagnosis Code(s):    --- Professional ---                       O13.086V, Food in esophagus causing other injury,                        initial encounter                       T18.108A, Unspecified foreign body in esophagus causing                        other injury, initial encounter                       K20.9, Esophagitis, unspecified CPT copyright 2017 American Medical Association. All rights reserved. The codes documented in this report are preliminary and upon coder review may  be  revised to meet current compliance requirements. [name], MD [name] MD, MD 06/12/2018 7:58:32 AM This report has been signed electronically. Number of Addenda: 0 Note Initiated On: 06/12/2018 7:12 AM      [facility]

## 2018-06-16 ENCOUNTER — Encounter: Payer: Self-pay | Admitting: Gastroenterology

## 2018-06-29 ENCOUNTER — Encounter: Payer: Self-pay | Admitting: Gastroenterology

## 2019-10-13 DIAGNOSIS — Z23 Encounter for immunization: Secondary | ICD-10-CM | POA: Diagnosis not present

## 2019-10-13 DIAGNOSIS — I10 Essential (primary) hypertension: Secondary | ICD-10-CM | POA: Diagnosis not present

## 2019-10-19 DIAGNOSIS — E781 Pure hyperglyceridemia: Secondary | ICD-10-CM | POA: Diagnosis not present

## 2019-10-19 DIAGNOSIS — E785 Hyperlipidemia, unspecified: Secondary | ICD-10-CM | POA: Diagnosis not present

## 2019-11-22 ENCOUNTER — Ambulatory Visit: Payer: Self-pay | Admitting: Podiatry

## 2019-12-06 ENCOUNTER — Ambulatory Visit: Payer: Self-pay | Admitting: Podiatry

## 2020-10-08 ENCOUNTER — Other Ambulatory Visit: Payer: Self-pay | Admitting: Registered Nurse

## 2020-10-08 DIAGNOSIS — K7460 Unspecified cirrhosis of liver: Secondary | ICD-10-CM

## 2020-10-24 ENCOUNTER — Other Ambulatory Visit: Payer: Self-pay

## 2020-10-24 ENCOUNTER — Ambulatory Visit
Admission: RE | Admit: 2020-10-24 | Discharge: 2020-10-24 | Disposition: A | Discharge status: Home / Self Care | Payer: Medicare Other | Source: Ambulatory Visit | Attending: Registered Nurse | Admitting: Registered Nurse

## 2020-10-24 DIAGNOSIS — K7460 Unspecified cirrhosis of liver: Secondary | ICD-10-CM | POA: Diagnosis not present

## 2021-09-05 ENCOUNTER — Ambulatory Visit: Payer: Self-pay | Admitting: General Surgery

## 2021-09-05 NOTE — H&P (Signed)
REFERRING PHYSICIAN:  Benjamine Sprague, DO   PROVIDER:  Monico Blitz, MD   MRN: [id number] DOB: 1956-07-17 DATE OF ENCOUNTER: 09/05/2021   Subjective    Chief Complaint: skim tag anal     History of Present Illness: Richard Rowe is a 65 y.o. male who is seen today as an office consultation at the request of Dr. Lysle Pearl for evaluation of skim tag anal   Pt with HIV on treatment with undetectable levels, also with h/o liver cirrhosis presents to office for evaluation of a reported positive anal pap test.  He states he has undetectable HIV titers and end stage liver disease.  He also has a lesion on his posterior  L thigh     Review of Systems: A complete review of systems was obtained from the patient.  I have reviewed this information and discussed as appropriate with the patient.  See HPI as well for other ROS.     Medical History: Past Medical History      Past Medical History:  Diagnosis Date   End-stage liver disease (CMS-HCC)          There is no problem list on file for this patient.     Past Surgical History  History reviewed. No pertinent surgical history.      Allergies  No Known Allergies           Current Outpatient Medications on File Prior to Visit  Medication Sig Dispense Refill   b complex vitamins capsule Take by mouth       BIKTARVY 50-200-25 mg tablet Take 1 tablet by mouth once daily       cholecalciferol (VITAMIN D3) 1000 unit tablet Take by mouth       elvitegravir-cobicistat-emtricitabine-tenofovir alafenamide (GENVOYA) 150-150-200-10 mg tablet Take 1 tablet by mouth once daily       enalapril (VASOTEC) 20 MG tablet Take 20 mg by mouth 2 (two) times daily       gabapentin (NEURONTIN) 600 MG tablet TAKE 1 TABLET BY MOUTH THREE TIMES DAILY       rosuvastatin (CRESTOR) 5 MG tablet Take 5 mg by mouth once daily        No current facility-administered medications on file prior to visit.      Family History       Family History   Problem Relation Age of Onset   High blood pressure (Hypertension) Sister     Hyperlipidemia (Elevated cholesterol) Sister     Breast cancer Sister          Social History       Tobacco Use  Smoking Status Current Some Day Smoker  Smokeless Tobacco Never Used      Social History  Social History        Socioeconomic History   Marital status: Single  Tobacco Use   Smoking status: Current Some Day Smoker   Smokeless tobacco: Never Used  Scientific laboratory technician Use: Never used  Substance and Sexual Activity   Alcohol use: Yes   Drug use: Yes        Objective:         Vitals:    09/05/21 1014  BP: 124/86  Pulse: 98  Temp: 36.8 C (98.2 F)  SpO2: 99%  Weight: 89 kg (196 lb 3.2 oz)  Height: 175.3 cm ('5\' 9"'$ )      Exam Gen: NAD CV: RRR Lungs: CTA Abd: soft Rectal: L posterior lesion, no fixed masses  Labs, Imaging and Diagnostic Testing: Anal pap 05/02/21:    Assessment and Plan:  Diagnoses and all orders for this visit:   Lipoma of left lower extremity Recommend excisional biopsy.  Risks include bleeding, pain and recurrence   Abnormal anal Papanicolaou smear We will obtain records on this from [facility] in [place].  Recommend anal pap with biopsy or excision.  Risks include bleeding, pain and missed pathology.     We will also obtain recent labwork related to liver function and HIV titers.

## 2021-11-11 ENCOUNTER — Other Ambulatory Visit: Payer: Self-pay

## 2021-11-11 ENCOUNTER — Encounter (HOSPITAL_BASED_OUTPATIENT_CLINIC_OR_DEPARTMENT_OTHER): Payer: Self-pay | Admitting: General Surgery

## 2021-11-11 NOTE — Progress Notes (Signed)
Spoke w/ via phone for pre-op interview--- pt Lab needs dos---- [employer] and ekg  (will enter urine drug screen, ask mda dos if needed)        Lab results------ no COVID test -----patient states asymptomatic no test needed Arrive at ------- 0630 on 11-13-2021 NPO after MN NO Solid Food.  Clear liquids from MN until--- 0530 Med rec completed Medications to take morning of surgery ----- gabapentin, biktarvy, genvoya, crestor Diabetic medication ----- n/a Patient instructed no nail polish to be worn day of surgery Patient instructed to bring photo id and insurance card day of surgery Patient aware to have Driver (ride ) / caregiver for 24 hours after surgery --sister, glenda [place] Patient Special Instructions ----- n/a Pre-Op special Istructions ----- pt stated last crack cocaine 11-01-2021 Patient verbalized understanding of instructions that were given at this phone interview. Patient denies shortness of breath, chest pain, fever, cough at this phone interview.

## 2021-11-13 ENCOUNTER — Ambulatory Visit (HOSPITAL_BASED_OUTPATIENT_CLINIC_OR_DEPARTMENT_OTHER): Payer: Medicare Other | Admitting: Anesthesiology

## 2021-11-13 ENCOUNTER — Other Ambulatory Visit: Payer: Self-pay

## 2021-11-13 ENCOUNTER — Encounter (HOSPITAL_BASED_OUTPATIENT_CLINIC_OR_DEPARTMENT_OTHER): Payer: Self-pay | Admitting: General Surgery

## 2021-11-13 ENCOUNTER — Ambulatory Visit (HOSPITAL_BASED_OUTPATIENT_CLINIC_OR_DEPARTMENT_OTHER)
Admission: RE | Admit: 2021-11-13 | Discharge: 2021-11-13 | Disposition: A | Discharge status: Home / Self Care | Payer: Medicare Other | Source: Ambulatory Visit | Attending: General Surgery | Admitting: General Surgery

## 2021-11-13 ENCOUNTER — Encounter (HOSPITAL_BASED_OUTPATIENT_CLINIC_OR_DEPARTMENT_OTHER)
Admission: RE | Disposition: A | Discharge status: Home / Self Care | Payer: Self-pay | Source: Ambulatory Visit | Attending: General Surgery

## 2021-11-13 DIAGNOSIS — D1724 Benign lipomatous neoplasm of skin and subcutaneous tissue of left leg: Secondary | ICD-10-CM | POA: Diagnosis present

## 2021-11-13 DIAGNOSIS — I10 Essential (primary) hypertension: Secondary | ICD-10-CM | POA: Insufficient documentation

## 2021-11-13 DIAGNOSIS — F1990 Other psychoactive substance use, unspecified, uncomplicated: Secondary | ICD-10-CM | POA: Diagnosis not present

## 2021-11-13 DIAGNOSIS — Z79899 Other long term (current) drug therapy: Secondary | ICD-10-CM | POA: Diagnosis not present

## 2021-11-13 DIAGNOSIS — F17200 Nicotine dependence, unspecified, uncomplicated: Secondary | ICD-10-CM | POA: Diagnosis not present

## 2021-11-13 DIAGNOSIS — K7460 Unspecified cirrhosis of liver: Secondary | ICD-10-CM | POA: Insufficient documentation

## 2021-11-13 DIAGNOSIS — F1910 Other psychoactive substance abuse, uncomplicated: Secondary | ICD-10-CM

## 2021-11-13 DIAGNOSIS — K6282 Dysplasia of anus: Secondary | ICD-10-CM | POA: Insufficient documentation

## 2021-11-13 DIAGNOSIS — Z21 Asymptomatic human immunodeficiency virus [HIV] infection status: Secondary | ICD-10-CM | POA: Diagnosis not present

## 2021-11-13 HISTORY — PX: LIPOMA EXCISION: SHX5283

## 2021-11-13 HISTORY — DX: Unspecified cirrhosis of liver: K74.60

## 2021-11-13 HISTORY — DX: Cocaine abuse, uncomplicated: F14.10

## 2021-11-13 HISTORY — DX: Benign lipomatous neoplasm, unspecified: D17.9

## 2021-11-13 HISTORY — DX: Personal history of other diseases of the digestive system: Z87.19

## 2021-11-13 HISTORY — PX: HIGH RESOLUTION ANOSCOPY: SHX6345

## 2021-11-13 HISTORY — DX: Personal history of latent tuberculosis infection: Z86.15

## 2021-11-13 HISTORY — DX: Other postherpetic nervous system involvement: B02.29

## 2021-11-13 HISTORY — DX: Personal history of other infectious and parasitic diseases: Z86.19

## 2021-11-13 HISTORY — DX: Alcohol dependence, uncomplicated: F10.20

## 2021-11-13 LAB — RAPID URINE DRUG SCREEN, HOSP PERFORMED
Amphetamines: NOT DETECTED
Barbiturates: NOT DETECTED
Benzodiazepines: NOT DETECTED
Cocaine: NOT DETECTED
Opiates: NOT DETECTED
Tetrahydrocannabinol: NOT DETECTED

## 2021-11-13 LAB — POCT I-STAT, CHEM 8
BUN: 7 mg/dL — ABNORMAL LOW (ref 8–23)
Calcium, Ion: 1.31 mmol/L (ref 1.15–1.40)
Chloride: 102 mmol/L (ref 98–111)
Creatinine, Ser: 1.3 mg/dL — ABNORMAL HIGH (ref 0.61–1.24)
Glucose, Bld: 107 mg/dL — ABNORMAL HIGH (ref 70–99)
HCT: 40 % (ref 39.0–52.0)
Hemoglobin: 13.6 g/dL (ref 13.0–17.0)
Potassium: 4.1 mmol/L (ref 3.5–5.1)
Sodium: 141 mmol/L (ref 135–145)
TCO2: 26 mmol/L (ref 22–32)

## 2021-11-13 SURGERY — ANOSCOPY, HIGH RESOLUTION
Anesthesia: Monitor Anesthesia Care | Site: Thigh

## 2021-11-13 MED ORDER — MIDAZOLAM HCL 5 MG/5ML IJ SOLN
INTRAMUSCULAR | Status: DC | PRN
  Administered 2021-11-13: 2 mg via INTRAVENOUS
  Administered 2021-11-13 (×2): 1 mg via INTRAVENOUS

## 2021-11-13 MED ORDER — SODIUM CHLORIDE 0.9% FLUSH: 3.0000 mL | Freq: Two times a day (BID) | INTRAVENOUS | Status: DC

## 2021-11-13 MED ORDER — KETOROLAC TROMETHAMINE 30 MG/ML IJ SOLN
INTRAMUSCULAR | Status: AC
  Filled 2021-11-13: qty 1

## 2021-11-13 MED ORDER — PROMETHAZINE HCL 25 MG/ML IJ SOLN: 6.2500 mg | INTRAMUSCULAR | Status: DC | PRN

## 2021-11-13 MED ORDER — PROPOFOL 10 MG/ML IV BOLUS
INTRAVENOUS | Status: AC
  Filled 2021-11-13: qty 20

## 2021-11-13 MED ORDER — LACTATED RINGERS IV SOLN: INTRAVENOUS | Status: DC

## 2021-11-13 MED ORDER — MIDAZOLAM HCL 2 MG/2ML IJ SOLN
INTRAMUSCULAR | Status: AC
  Filled 2021-11-13: qty 2

## 2021-11-13 MED ORDER — BUPIVACAINE-EPINEPHRINE 0.5% -1:200000 IJ SOLN
INTRAMUSCULAR | Status: DC | PRN
  Administered 2021-11-13: 30 mL

## 2021-11-13 MED ORDER — OXYCODONE HCL 5 MG PO TABS: 5.0000 mg | ORAL_TABLET | Freq: Once | ORAL | Status: DC | PRN

## 2021-11-13 MED ORDER — ACETAMINOPHEN 325 MG PO TABS: 325.0000 mg | ORAL_TABLET | ORAL | Status: DC | PRN

## 2021-11-13 MED ORDER — CEFAZOLIN SODIUM-DEXTROSE 2-4 GM/100ML-% IV SOLN
INTRAVENOUS | Status: AC
  Filled 2021-11-13: qty 100

## 2021-11-13 MED ORDER — ACETAMINOPHEN 10 MG/ML IV SOLN: 1000.0000 mg | Freq: Once | INTRAVENOUS | Status: DC | PRN

## 2021-11-13 MED ORDER — SODIUM CHLORIDE 0.9% FLUSH: 3.0000 mL | INTRAVENOUS | Status: DC | PRN

## 2021-11-13 MED ORDER — DEXAMETHASONE SODIUM PHOSPHATE 4 MG/ML IJ SOLN
INTRAMUSCULAR | Status: DC | PRN
  Administered 2021-11-13: 5 mg via INTRAVENOUS

## 2021-11-13 MED ORDER — CEFAZOLIN SODIUM-DEXTROSE 2-4 GM/100ML-% IV SOLN
2.0000 g | INTRAVENOUS | Status: AC
  Administered 2021-11-13: 2 g via INTRAVENOUS

## 2021-11-13 MED ORDER — AMISULPRIDE (ANTIEMETIC) 5 MG/2ML IV SOLN: 10.0000 mg | Freq: Once | INTRAVENOUS | Status: DC | PRN

## 2021-11-13 MED ORDER — ONDANSETRON HCL 4 MG/2ML IJ SOLN
INTRAMUSCULAR | Status: DC | PRN
  Administered 2021-11-13: 4 mg via INTRAVENOUS

## 2021-11-13 MED ORDER — BUPIVACAINE LIPOSOME 1.3 % IJ SUSP
INTRAMUSCULAR | Status: DC | PRN
  Administered 2021-11-13: 20 mL

## 2021-11-13 MED ORDER — OXYCODONE HCL 5 MG/5ML PO SOLN: 5.0000 mg | Freq: Once | ORAL | Status: DC | PRN

## 2021-11-13 MED ORDER — KETOROLAC TROMETHAMINE 30 MG/ML IJ SOLN
INTRAMUSCULAR | Status: DC | PRN
  Administered 2021-11-13: 30 mg via INTRAVENOUS

## 2021-11-13 MED ORDER — ACETIC ACID 5 % SOLN
Status: DC | PRN
  Administered 2021-11-13: 1 via TOPICAL

## 2021-11-13 MED ORDER — ONDANSETRON HCL 4 MG/2ML IJ SOLN
INTRAMUSCULAR | Status: AC
  Filled 2021-11-13: qty 2

## 2021-11-13 MED ORDER — ACETAMINOPHEN 160 MG/5ML PO SOLN: 325.0000 mg | ORAL | Status: DC | PRN

## 2021-11-13 MED ORDER — SODIUM CHLORIDE 0.9 % IV SOLN: 250.0000 mL | INTRAVENOUS | Status: DC | PRN

## 2021-11-13 MED ORDER — PROPOFOL 500 MG/50ML IV EMUL
INTRAVENOUS | Status: DC | PRN
  Administered 2021-11-13: 100 ug/kg/min via INTRAVENOUS

## 2021-11-13 MED ORDER — ACETAMINOPHEN 325 MG RE SUPP: 650.0000 mg | RECTAL | Status: DC | PRN

## 2021-11-13 MED ORDER — FENTANYL CITRATE (PF) 100 MCG/2ML IJ SOLN
INTRAMUSCULAR | Status: AC
  Filled 2021-11-13: qty 2

## 2021-11-13 MED ORDER — FENTANYL CITRATE (PF) 100 MCG/2ML IJ SOLN: 25.0000 ug | INTRAMUSCULAR | Status: DC | PRN

## 2021-11-13 MED ORDER — LIDOCAINE 5 % EX OINT
TOPICAL_OINTMENT | CUTANEOUS | Status: DC | PRN
  Administered 2021-11-13: 1 via TOPICAL

## 2021-11-13 MED ORDER — 0.9 % SODIUM CHLORIDE (POUR BTL) OPTIME
TOPICAL | Status: DC | PRN
  Administered 2021-11-13: 500 mL

## 2021-11-13 MED ORDER — OXYCODONE HCL 5 MG PO TABS: 5.0000 mg | ORAL_TABLET | Freq: Four times a day (QID) | ORAL | 0 refills | Status: AC | PRN

## 2021-11-13 MED ORDER — FENTANYL CITRATE (PF) 100 MCG/2ML IJ SOLN
INTRAMUSCULAR | Status: DC | PRN
  Administered 2021-11-13 (×4): 25 ug via INTRAVENOUS

## 2021-11-13 MED ORDER — PROPOFOL 1000 MG/100ML IV EMUL
INTRAVENOUS | Status: AC
  Filled 2021-11-13: qty 100

## 2021-11-13 MED ORDER — ACETAMINOPHEN 325 MG PO TABS: 650.0000 mg | ORAL_TABLET | ORAL | Status: DC | PRN

## 2021-11-13 MED ORDER — OXYCODONE HCL 5 MG PO TABS: 5.0000 mg | ORAL_TABLET | ORAL | Status: DC | PRN

## 2021-11-13 SURGICAL SUPPLY — 61 items
ADH SKN CLS APL DERMABOND .7 (GAUZE/BANDAGES/DRESSINGS)
APL PRP STRL LF DISP 70% ISPRP (MISCELLANEOUS) ×2
APL SKNCLS STERI-STRIP NONHPOA (GAUZE/BANDAGES/DRESSINGS) ×2
APL SWBSTK 6 STRL LF DISP (MISCELLANEOUS)
APPLICATOR COTTON TIP 6 STRL (MISCELLANEOUS) IMPLANT
APPLICATOR COTTON TIP 6IN STRL (MISCELLANEOUS)
BENZOIN TINCTURE PRP APPL 2/3 (GAUZE/BANDAGES/DRESSINGS) ×4 IMPLANT
BLADE CLIPPER SENSICLIP SURGIC (BLADE) IMPLANT
BLADE EXTENDED COATED 6.5IN (ELECTRODE) IMPLANT
BLADE SURG 10 STRL SS (BLADE) ×4 IMPLANT
CHLORAPREP W/TINT 26 (MISCELLANEOUS) ×4 IMPLANT
CLOSURE WOUND 1/2 X4 (GAUZE/BANDAGES/DRESSINGS)
COVER BACK TABLE 60X90IN (DRAPES) ×4 IMPLANT
COVER MAYO STAND STRL (DRAPES) ×4 IMPLANT
DECANTER SPIKE VIAL GLASS SM (MISCELLANEOUS) ×4 IMPLANT
DERMABOND ADVANCED (GAUZE/BANDAGES/DRESSINGS)
DERMABOND ADVANCED .7 DNX12 (GAUZE/BANDAGES/DRESSINGS) IMPLANT
DRAPE LAPAROTOMY 100X72 PEDS (DRAPES) ×4 IMPLANT
DRAPE UTILITY XL STRL (DRAPES) ×4 IMPLANT
DRSG OPSITE 11X17.75 LRG (GAUZE/BANDAGES/DRESSINGS) ×4 IMPLANT
DRSG PAD ABDOMINAL 8X10 ST (GAUZE/BANDAGES/DRESSINGS) ×4 IMPLANT
DRSG TEGADERM 4X4.75 (GAUZE/BANDAGES/DRESSINGS) IMPLANT
ELECT REM PT RETURN 9FT ADLT (ELECTROSURGICAL) ×4
ELECTRODE REM PT RTRN 9FT ADLT (ELECTROSURGICAL) ×2 IMPLANT
GAUZE 4X4 16PLY ~~LOC~~+RFID DBL (SPONGE) ×4 IMPLANT
GAUZE SPONGE 4X4 12PLY STRL (GAUZE/BANDAGES/DRESSINGS) ×4 IMPLANT
GAUZE SPONGE 4X4 12PLY STRL LF (GAUZE/BANDAGES/DRESSINGS) ×4 IMPLANT
GLOVE SURG ENC MOIS LTX SZ6.5 (GLOVE) ×8 IMPLANT
GLOVE SURG UNDER LTX SZ6.5 (GLOVE) ×4 IMPLANT
KIT SIGMOIDOSCOPE (SET/KITS/TRAYS/PACK) IMPLANT
KIT TURNOVER CYSTO (KITS) ×4 IMPLANT
NEEDLE HYPO 22GX1.5 SAFETY (NEEDLE) ×4 IMPLANT
NS IRRIG 500ML POUR BTL (IV SOLUTION) ×4 IMPLANT
PACK BASIN DAY SURGERY FS (CUSTOM PROCEDURE TRAY) ×4 IMPLANT
PAD ARMBOARD 7.5X6 YLW CONV (MISCELLANEOUS) IMPLANT
PANTS MESH DISP LRG (UNDERPADS AND DIAPERS) ×2 IMPLANT
PANTS MESH DISPOSABLE L (UNDERPADS AND DIAPERS) ×2
PENCIL SMOKE EVACUATOR (MISCELLANEOUS) ×4 IMPLANT
SPONGE SURGIFOAM ABS GEL 12-7 (HEMOSTASIS) IMPLANT
STRIP CLOSURE SKIN 1/2X4 (GAUZE/BANDAGES/DRESSINGS) IMPLANT
SUT CHROMIC 2 0 SH (SUTURE) IMPLANT
SUT CHROMIC 3 0 SH 27 (SUTURE) IMPLANT
SUT ETHILON 2 0 FS 18 (SUTURE) IMPLANT
SUT ETHILON 3 0 PS 1 (SUTURE) ×4 IMPLANT
SUT ETHILON 4 0 PS 2 18 (SUTURE) IMPLANT
SUT SILK 2 0 SH (SUTURE) IMPLANT
SUT VIC AB 2-0 SH 27 (SUTURE) ×4
SUT VIC AB 2-0 SH 27XBRD (SUTURE) ×2 IMPLANT
SUT VIC AB 3-0 SH 18 (SUTURE) IMPLANT
SUT VIC AB 4-0 PS2 18 (SUTURE) IMPLANT
SUT VIC AB 4-0 SH 18 (SUTURE) IMPLANT
SWAB CULTURE ESWAB REG 1ML (MISCELLANEOUS) IMPLANT
SYR BULB IRRIG 60ML STRL (SYRINGE) ×4 IMPLANT
SYR CONTROL 10ML LL (SYRINGE) ×4 IMPLANT
TOWEL OR 17X26 10 PK STRL BLUE (TOWEL DISPOSABLE) ×4 IMPLANT
TRAY DSU PREP LF (CUSTOM PROCEDURE TRAY) ×4 IMPLANT
TUBE CONNECTING 12'X1/4 (SUCTIONS) ×1
TUBE CONNECTING 12X1/4 (SUCTIONS) ×3 IMPLANT
UNDERPAD 30X36 HEAVY ABSORB (UNDERPADS AND DIAPERS) IMPLANT
WATER STERILE IRR 500ML POUR (IV SOLUTION) IMPLANT
YANKAUER SUCT BULB TIP NO VENT (SUCTIONS) ×4 IMPLANT

## 2021-11-13 NOTE — H&P (Addendum)
History of Present Illness: Richard Rowe is a 65 y.o. male who is seen today as an office consultation at the request of Dr. Lysle Pearl for evaluation of skim tag anal   Pt with HIV on treatment with undetectable levels, also with h/o liver cirrhosis presents to office for evaluation of a reported positive anal pap test.  He states he has undetectable HIV titers and end stage liver disease.  He also has a lesion on his posterior  L thigh     Review of Systems: A complete review of systems was obtained from the patient.  I have reviewed this information and discussed as appropriate with the patient.  See HPI as well for other ROS.     Medical History: Past Medical History         Past Medical History:  Diagnosis Date   End-stage liver disease (CMS-HCC)          There is no problem list on file for this patient.     Past Surgical History  History reviewed. No pertinent surgical history.      Allergies  No Known Allergies                 Current Outpatient Medications on File Prior to Visit  Medication Sig Dispense Refill   b complex vitamins capsule Take by mouth       BIKTARVY 50-200-25 mg tablet Take 1 tablet by mouth once daily       cholecalciferol (VITAMIN D3) 1000 unit tablet Take by mouth       elvitegravir-cobicistat-emtricitabine-tenofovir alafenamide (GENVOYA) 150-150-200-10 mg tablet Take 1 tablet by mouth once daily       enalapril (VASOTEC) 20 MG tablet Take 20 mg by mouth 2 (two) times daily       gabapentin (NEURONTIN) 600 MG tablet TAKE 1 TABLET BY MOUTH THREE TIMES DAILY       rosuvastatin (CRESTOR) 5 MG tablet Take 5 mg by mouth once daily        No current facility-administered medications on file prior to visit.      Family History           Family History  Problem Relation Age of Onset   High blood pressure (Hypertension) Sister     Hyperlipidemia (Elevated cholesterol) Sister     Breast cancer Sister          Social History         Tobacco  Use  Smoking Status Current Some Day Smoker  Smokeless Tobacco Never Used      Social History  Social History           Socioeconomic History   Marital status: Single  Tobacco Use   Smoking status: Current Some Day Smoker   Smokeless tobacco: Never Used  Scientific laboratory technician Use: Never used  Substance and Sexual Activity   Alcohol use: Yes   Drug use: Yes        Objective:      BP (!) 156/88   Pulse 89   Temp 98.8 F (37.1 C) (Oral)   Resp 16   Ht 5\' 6"  (1.676 m)   Wt 91.2 kg   SpO2 99%   BMI 32.44 kg/m     Exam Gen: NAD CV: RRR Lungs: CTA Abd: soft Rectal: L posterior lesion, no fixed masses     Labs, Imaging and Diagnostic Testing: Anal pap 05/02/21: indeterminate   Assessment and Plan:  Diagnoses and all orders  for this visit:   Lipoma of left lower extremity Recommend excisional biopsy.  Risks include bleeding, pain and recurrence   Abnormal anal Papanicolaou smear Anal pap was indeterminate.  Recommend high resolution anoscopy with biopsy or excision.  Risks include bleeding, pain, recurrence and missed pathology.

## 2021-11-13 NOTE — Transfer of Care (Signed)
Immediate Anesthesia Transfer of Care Note  Patient: Richard Rowe  Procedure(s) Performed: Procedure(s) (LRB): HIGH RESOLUTION ANOSCOPY (N/A) EXCISIONAL BIOPSY LEFT POSTERIOR THIGH LIPOMA (Left)  Patient Location: PACU  Anesthesia Type: MAC  Level of Consciousness: awake, sedated, patient cooperative and responds to stimulation  Airway & Oxygen Therapy: Patient Spontanous Breathing and Patient connected to [place] 02 and soft FM   Post-op Assessment: Report given to PACU RN, Post -op Vital signs reviewed and stable and Patient moving all extremities  Post vital signs: Reviewed and stable  Complications: No apparent anesthesia complications

## 2021-11-13 NOTE — Discharge Instructions (Addendum)
FOR YOUR ANAL PROCEDURE  Beginning the day after surgery:  You may sit in a tub of warm water 2-3 times a day to relieve discomfort.  Eat a regular diet high in fiber.  Avoid foods that give you constipation or diarrhea.  Avoid foods that are difficult to digest, such as seeds, nuts, corn or popcorn.  Do not go any longer than 2 days without a bowel movement.  You may take a dose of Milk of Magnesia if you become constipated.    Drink 6-8 glasses of water daily.  Walking is encouraged.  Avoid strenuous activity and heavy lifting for one month after surgery.    Call the office if you have any questions or concerns.  Call immediately if you develop:  Excessive rectal bleeding (more than a cup or passing large clots) Increased discomfort Fever greater than 100 F Difficulty urinating   GENERAL SURGERY: POST OP INSTRUCTIONS  DIET: Follow a light bland diet the first 24 hours after arrival home, such as soup, liquids, crackers, etc.  Be sure to include lots of fluids daily.  Avoid fast food or heavy meals as your are more likely to get nauseated.   Take your usually prescribed home medications unless otherwise directed. PAIN CONTROL: Pain is best controlled by a usual combination of three different methods TOGETHER: Ice/Heat Over the counter pain medication Prescription pain medication Most patients will experience some swelling and bruising around the incisions.  Ice packs or heating pads (30-60 minutes up to 6 times a day) will help. Use ice for the first few days to help decrease swelling and bruising, then switch to heat to help relax tight/sore spots and speed recovery.  Some people prefer to use ice alone, heat alone, alternating between ice & heat.  Experiment to what works for you.  Swelling and bruising can take several weeks to resolve.   It is helpful to take an over-the-counter pain medication regularly for the first few weeks.  Choose one of the following that works best for  you: Naproxen (Aleve, etc)  Two 220mg  tabs twice a day Ibuprofen (Advil, etc) Three 200mg  tabs four times a day (every meal & bedtime) A  prescription for pain medication (such as Percocet, oxycodone, hydrocodone, etc) should be given to you upon discharge.  Take your pain medication as prescribed.  If you are having problems/concerns with the prescription medicine (does not control pain, nausea, vomiting, rash, itching, etc), please call us [phone] to see if we need to switch you to a different pain medicine that will work better for you and/or control your side effect better. If you need a refill on your pain medication, please contact your pharmacy.  They will contact our office to request authorization. Prescriptions will not be filled after 5 pm or on week-ends. Avoid getting constipated.  Between the surgery and the pain medications, it is common to experience some constipation.  Increasing fluid intake and taking a fiber supplement (such as Metamucil, Citrucel, FiberCon, MiraLax, etc) 1-2 times a day regularly will usually help prevent this problem from occurring.  A mild laxative (prune juice, Milk of Magnesia, MiraLax, etc) should be taken according to package directions if there are no bowel movements after 48 hours.   Wash / shower every day.  You may shower over the dressings as they are waterproof.  Continue to shower over incision(s) after the dressing is off. Remove your waterproof bandages 3 days after surgery.  You may leave the incision open  to air.  You may replace a dressing/Band-Aid to cover the incision for comfort if you wish.   ACTIVITIES as tolerated:   You may resume regular (light) daily activities beginning the next day--such as daily self-care, walking, climbing stairs--gradually increasing activities as tolerated.  If you can walk 30 minutes without difficulty, it is safe to try more intense activity such as jogging, treadmill, bicycling, low-impact aerobics, swimming,  etc. Save the most intensive and strenuous activity for last such as sit-ups, heavy lifting, contact sports, etc  Refrain from any heavy lifting or straining until you are off narcotics for pain control.   DO NOT PUSH THROUGH PAIN.  Let pain be your guide: If it hurts to do something, don't do it.  Pain is your body warning you to avoid that activity for another week until the pain goes down. You may drive when you are no longer taking prescription pain medication, you can comfortably wear a seatbelt, and you can safely maneuver your car and apply brakes. You may have sexual intercourse when it is comfortable.  FOLLOW UP in our office Please call [facility] at [phone] to set up an appointment to see your surgeon in the office for a follow-up appointment approximately 2-3 weeks after your surgery. Make sure that you call for this appointment the day you arrive home to insure a convenient appointment time. 9. IF YOU HAVE DISABILITY OR FAMILY LEAVE FORMS, BRING THEM TO THE OFFICE FOR PROCESSING.  DO NOT GIVE THEM TO YOUR DOCTOR.   WHEN TO CALL us [phone]: Poor pain control Reactions / problems with new medications (rash/itching, nausea, etc)  Fever over 101.5 F (38.5 C) Worsening swelling or bruising Continued bleeding from incision. Increased pain, redness, or drainage from the incision   The clinic staff is available to answer your questions during regular business hours (8:30am-5pm).  Please don't hesitate to call and ask to speak to one of our nurses for clinical concerns.   If you have a medical emergency, go to the nearest emergency room or call 911.  A surgeon from [facility] is always on call at the [facility], [place], [place], [place], [place]  [address] ? MAIN: [phone] ? TOLL FREE: [phone] ?  FAX [phone] [email or web address]   Post Anesthesia Home Care Instructions  Activity: Get  plenty of rest for the remainder of the day. A responsible individual must stay with you for 24 hours following the procedure.  For the next 24 hours, DO NOT: -Drive a car -Paediatric nurse -Drink alcoholic beverages -Take any medication unless instructed by your physician -Make any legal decisions or sign important papers.  Meals: Start with liquid foods such as gelatin or soup. Progress to regular foods as tolerated. Avoid greasy, spicy, heavy foods. If nausea and/or vomiting occur, drink only clear liquids until the nausea and/or vomiting subsides. Call your physician if vomiting continues.  Special Instructions/Symptoms: Your throat may feel dry or sore from the anesthesia or the breathing tube placed in your throat during surgery. If this causes discomfort, gargle with warm salt water. The discomfort should disappear within 24 hours.  No ibuprofen, Advil, Aleve, Motrin, ketorolac, meloxicam, naproxen, or other NSAIDS until after 3:15 pm today if needed.  Information for Discharge Teaching: EXPAREL (bupivacaine liposome injectable suspension)   Your surgeon or anesthesiologist gave you EXPAREL(bupivacaine) to help control your pain after surgery.  EXPAREL is a local anesthetic that provides pain relief  by numbing the tissue around the surgical site. EXPAREL is designed to release pain medication over time and can control pain for up to 72 hours. Depending on how you respond to EXPAREL, you may require less pain medication during your recovery.  Possible side effects: Temporary loss of sensation or ability to move in the area where bupivacaine was injected. Nausea, vomiting, constipation Rarely, numbness and tingling in your mouth or lips, lightheadedness, or anxiety may occur. Call your doctor right away if you think you may be experiencing any of these sensations, or if you have other questions regarding possible side effects.  Follow all other discharge instructions given to you  by your surgeon or nurse. Eat a healthy diet and drink plenty of water or other fluids.  If you return to the hospital for any reason within 96 hours following the administration of EXPAREL, it is important for health care providers to know that you have received this anesthetic. A teal colored band has been placed on your arm with the date, time and amount of EXPAREL you have received in order to alert and inform your health care providers. Please leave this armband in place for the full 96 hours following administration, and then you may remove the band.

## 2021-11-13 NOTE — Anesthesia Procedure Notes (Signed)
Procedure Name: MAC Date/Time: 11/13/2021 8:35 AM Performed by: Justice Rocher, CRNA Pre-anesthesia Checklist: Timeout performed, Patient being monitored, Suction available, Emergency Drugs available and Patient identified Patient Re-evaluated:Patient Re-evaluated prior to induction Oxygen Delivery Method: Simple face mask Preoxygenation: Pre-oxygenation with 100% oxygen Induction Type: IV induction Placement Confirmation: breath sounds checked- equal and bilateral, CO2 detector and positive ETCO2

## 2021-11-13 NOTE — Anesthesia Preprocedure Evaluation (Addendum)
Anesthesia Evaluation  Patient identified by MRN, date of birth, ID band Patient awake    Reviewed: Allergy & Precautions, NPO status , Patient's Chart, lab work & pertinent test results  Airway Mallampati: I  TM Distance: >3 FB Neck ROM: Full    Dental  (+) Edentulous Upper, Poor Dentition, Missing, Dental Advisory Given   Pulmonary Current Smoker,    breath sounds clear to auscultation       Cardiovascular hypertension, Pt. on medications  Rhythm:Regular Rate:Normal     Neuro/Psych negative neurological ROS  negative psych ROS   GI/Hepatic negative GI ROS, Neg liver ROS,   Endo/Other  negative endocrine ROS  Renal/GU negative Renal ROS     Musculoskeletal negative musculoskeletal ROS (+)   Abdominal Normal abdominal exam  (+)   Peds  Hematology negative hematology ROS (+)   Anesthesia Other Findings   Reproductive/Obstetrics                            Anesthesia Physical Anesthesia Plan  ASA: 3  Anesthesia Plan: MAC   Post-op Pain Management:    Induction: Intravenous  PONV Risk Score and Plan: 1 and Ondansetron, Propofol infusion and Midazolam  Airway Management Planned: Simple Face Mask and Natural Airway  Additional Equipment: None  Intra-op Plan:   Post-operative Plan:   Informed Consent: I have reviewed the patients History and Physical, chart, labs and discussed the procedure including the risks, benefits and alternatives for the proposed anesthesia with the patient or authorized representative who has indicated his/her understanding and acceptance.       Plan Discussed with: CRNA  Anesthesia Plan Comments: (Lab Results      Component                Value               Date                      WBC                      9.4                 06/12/2018                HGB                      13.6                11/13/2021                HCT                       40.0                11/13/2021                MCV                      91.6                06/12/2018                PLT                      301  06/12/2018           )       Anesthesia Quick Evaluation

## 2021-11-13 NOTE — Anesthesia Postprocedure Evaluation (Signed)
Anesthesia Post Note  Patient: Inocente E [place]  Procedure(s) Performed: HIGH RESOLUTION ANOSCOPY (Rectum) EXCISIONAL BIOPSY LEFT POSTERIOR THIGH LIPOMA (Left: Thigh)     Patient location during evaluation: PACU Anesthesia Type: MAC Level of consciousness: awake and alert Pain management: pain level controlled Vital Signs Assessment: post-procedure vital signs reviewed and stable Respiratory status: spontaneous breathing, nonlabored ventilation, respiratory function stable and patient connected to nasal cannula oxygen Cardiovascular status: stable and blood pressure returned to baseline Postop Assessment: no apparent nausea or vomiting Anesthetic complications: no   No notable events documented.  Last Vitals:  Vitals:   11/13/21 1000 11/13/21 1041  BP: (!) 141/86 (!) 143/87  Pulse: 75 71  Resp: (!) 30 16  Temp: (!) 36.2 C (!) 36.3 C  SpO2: 100% 100%    Last Pain:  Vitals:   11/13/21 1041  TempSrc:   PainSc: 0-No pain                 Effie Berkshire

## 2021-11-13 NOTE — Op Note (Signed)
11/13/2021  9:27 AM  PATIENT:  Richard Rowe  65 y.o. male  Patient Care Team: [place] as PCP - General  PRE-OPERATIVE DIAGNOSIS:  POSSIBLE ANAL POLYP, LIPOMA  POST-OPERATIVE DIAGNOSIS:  POSSIBLE AIN, LIPOMA  PROCEDURE:  HIGH RESOLUTION ANOSCOPY w BIOPSY EXCISIONAL BIOPSY LEFT POSTERIOR THIGH LIPOMA   Surgeon(s): Leighton Ruff, MD  ASSISTANT: none   ANESTHESIA:   local and MAC  SPECIMEN:  Source of Specimen:  L thigh lipoma, Anterior midline anal canal  DISPOSITION OF SPECIMEN:  PATHOLOGY  COUNTS:  YES  PLAN OF CARE: Discharge to home after PACU  PATIENT DISPOSITION:  PACU - hemodynamically stable.  INDICATION: 65 y.o. M with indeterminate anal Pap and HIV as well as a chronic L posterior thigh mass   OR FINDINGS: L posterior thigh lipoma (3x4cm), anterior midline aceto white staining  DESCRIPTION: the patient was identified in the preoperative holding area and taken to the OR where they were laid on the operating room table.  MAC anesthesia was induced without difficulty. The patient was then positioned in prone jackknife position with buttocks gently taped apart.  The patient was then prepped and draped in usual sterile fashion.  SCDs were noted to be in place prior to the initiation of anesthesia. A surgical timeout was performed indicating the correct patient, procedure, positioning and need for preoperative antibiotics.  A rectal block was performed using Marcaine with epinephrine mixed with Experel.    I began with the left posterior thigh mass.  An incision was made around this mass using a 10 blade scalpel.  Dissection was carried down through the subcutaneous tissues using electrocautery.  Lipoma extended underneath the skin for at least another centimeter.  The entire lipoma was removed.  This was then sent to pathology for further examination.  Hemostasis was achieved within the wound.  The subcutaneous tissue and dermal layer was  reapproximated using interrupted 2-0 Vicryl sutures.  The skin was closed using interrupted 2-0 nylon mattress sutures.  A temporary sterile dressing was applied.    We then turned our attention to the anal canal.  I began with a digital rectal exam.  No masses were noted.  The patient had a grade 2 left posterior hemorrhoid.  I then placed a Hill-Ferguson anoscope into the anal canal and evaluated this completely.  There was no other hemorrhoid deep disease noted.  The tissue inside the anal canal was friable.  I placed an acetic acid soaked sponge within the anal canal and 1 over the perianal area and allowed these to sit on his skin for 2 minutes.  We then remove these and evaluated the perianal area with the colposcope.  There were no acetowhite staining lesions noted.  We then evaluated the internal anal canal.  Due to the friability of the tissues it was difficult to get good visualization but no obvious acetowhite staining was noted except for at the anterior midline anal canal.  This was biopsied using Metzenbaum scissors and sent to pathology for further examination.  The biopsy site was closed using a 3-0 chromic suture.  I inspected the entire anal canal once again.  There was 1 area of bleeding that was controlled with electrocautery.  There were no other acetowhite staining lesions noted.  Additional Marcaine was placed at the biopsy site.  The patient was then awakened from anesthesia and sent to the postanesthesia care unit in stable condition.  All counts were correct per operating room staff.

## 2021-11-14 ENCOUNTER — Encounter (HOSPITAL_BASED_OUTPATIENT_CLINIC_OR_DEPARTMENT_OTHER): Payer: Self-pay | Admitting: General Surgery

## 2021-11-14 LAB — SURGICAL PATHOLOGY

## 2021-12-05 ENCOUNTER — Other Ambulatory Visit: Payer: Self-pay | Admitting: Nurse Practitioner

## 2021-12-05 DIAGNOSIS — K7460 Unspecified cirrhosis of liver: Secondary | ICD-10-CM

## 2021-12-12 ENCOUNTER — Other Ambulatory Visit: Payer: Self-pay

## 2021-12-12 ENCOUNTER — Ambulatory Visit
Admission: RE | Admit: 2021-12-12 | Discharge: 2021-12-12 | Disposition: A | Discharge status: Home / Self Care | Payer: Medicare Other | Source: Ambulatory Visit | Attending: Nurse Practitioner | Admitting: Nurse Practitioner

## 2021-12-12 DIAGNOSIS — K7460 Unspecified cirrhosis of liver: Secondary | ICD-10-CM | POA: Diagnosis present

## 2021-12-12 IMAGING — US US ABDOMEN LIMITED
1 series · 14 of 25 positions shown · non-contrast
Comparison: None.

CLINICAL DATA: Cirrhosis, screening for HCC

EXAM:
ULTRASOUND ABDOMEN LIMITED RIGHT UPPER QUADRANT

[Series 1: us abdomen limited · 0.22mm/px · 14 of 61 slices shown]
[im 1/61]
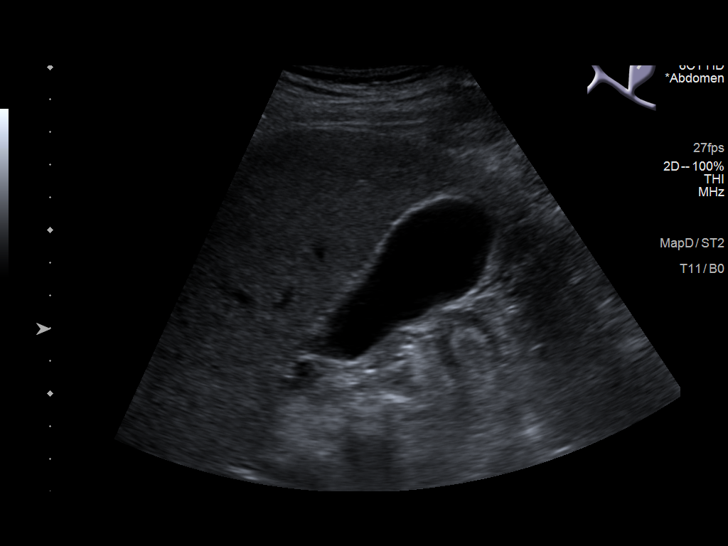
[im 6/61]
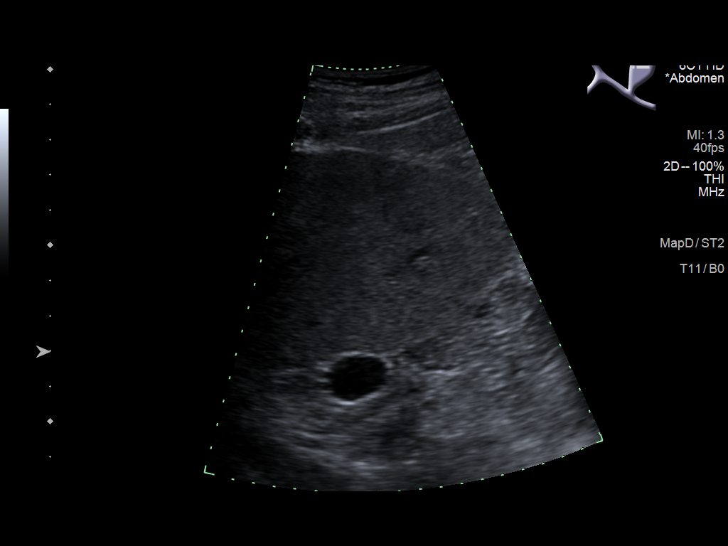
[im 11/61]
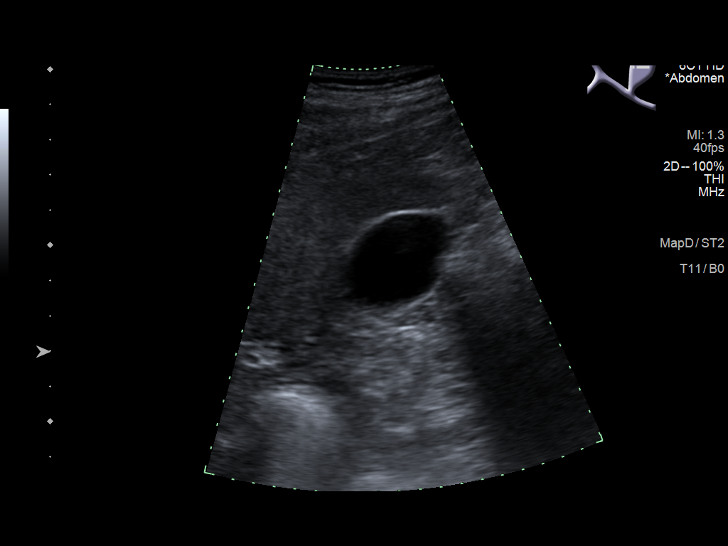
[im 16/61]
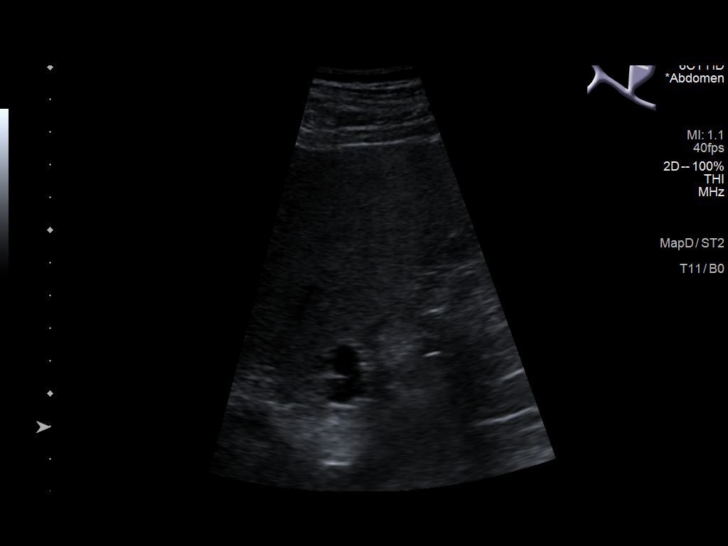
[im 21/61]
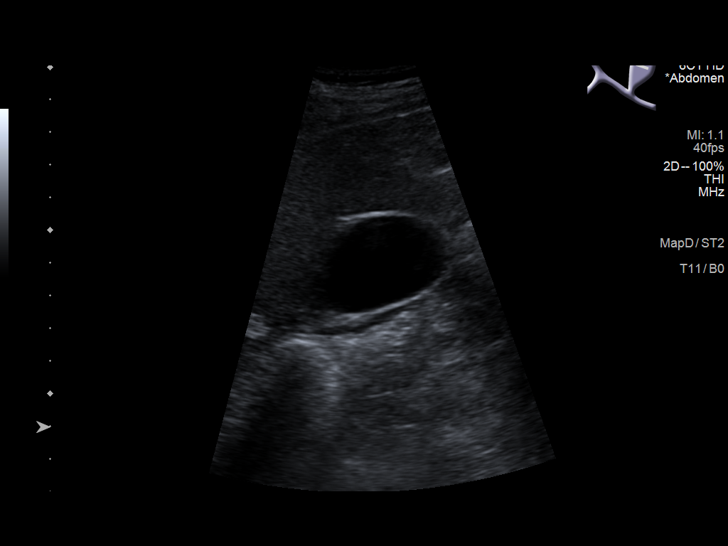
[im 23/61]
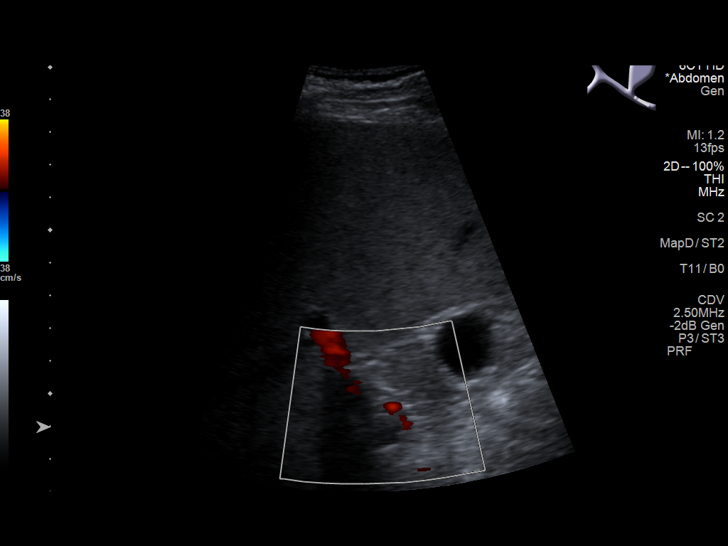
[im 28/61]
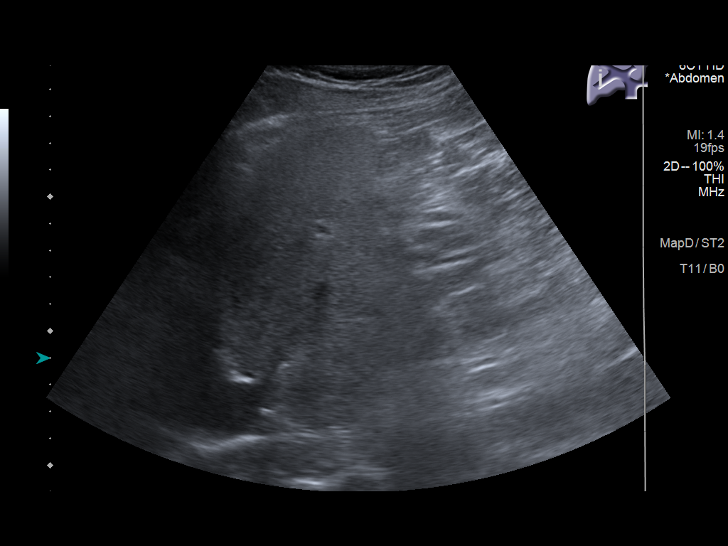
[im 33/61]
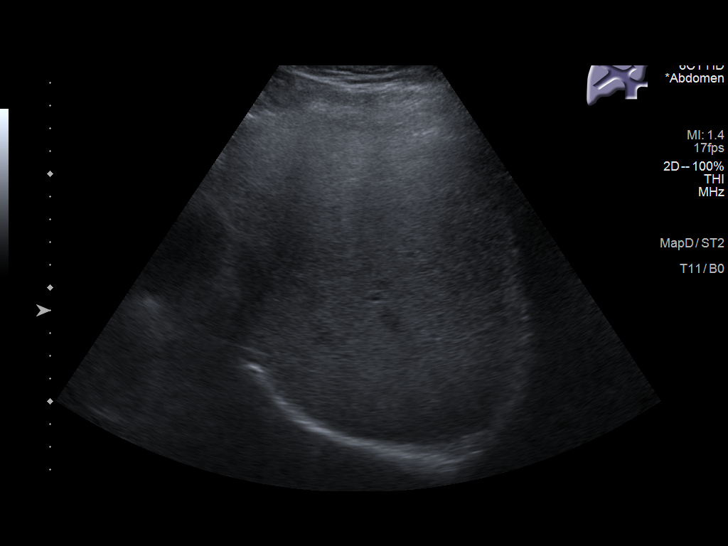
[im 38/61]
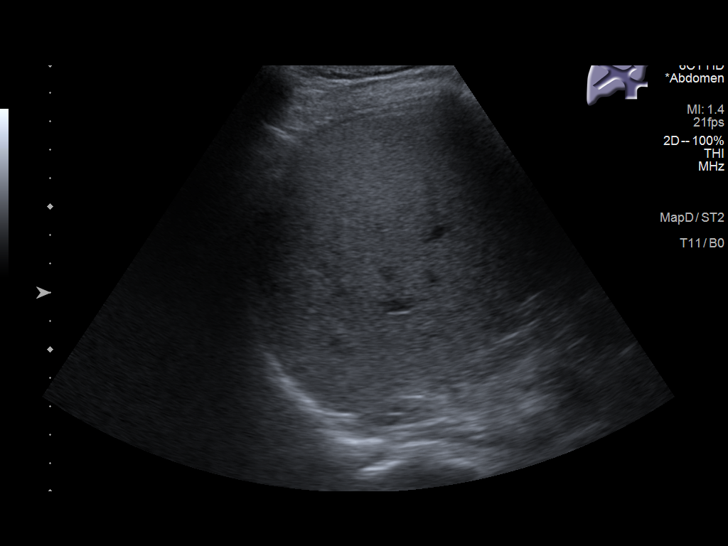
[im 41/61]
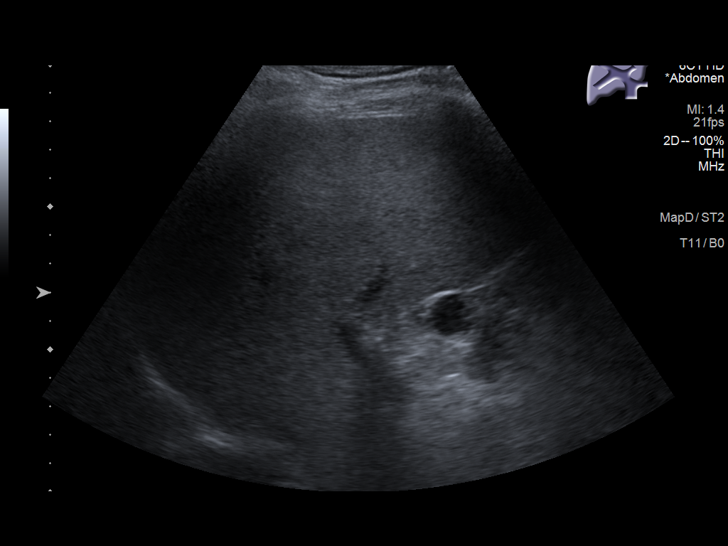
[im 46/61]
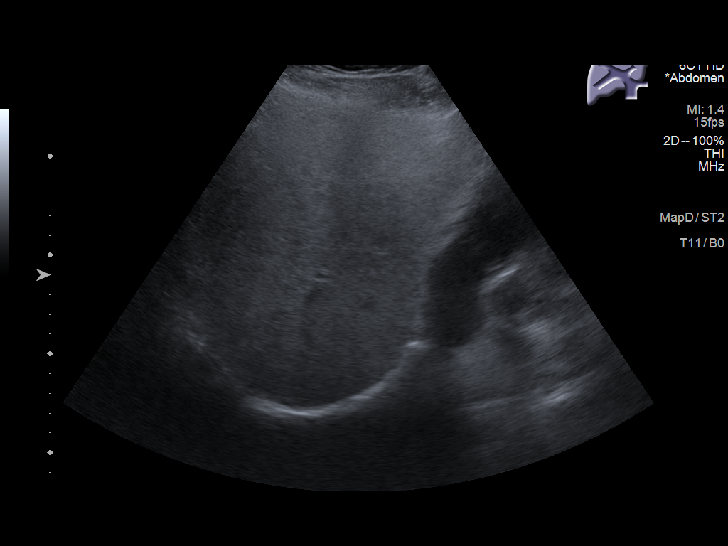
[im 51/61]
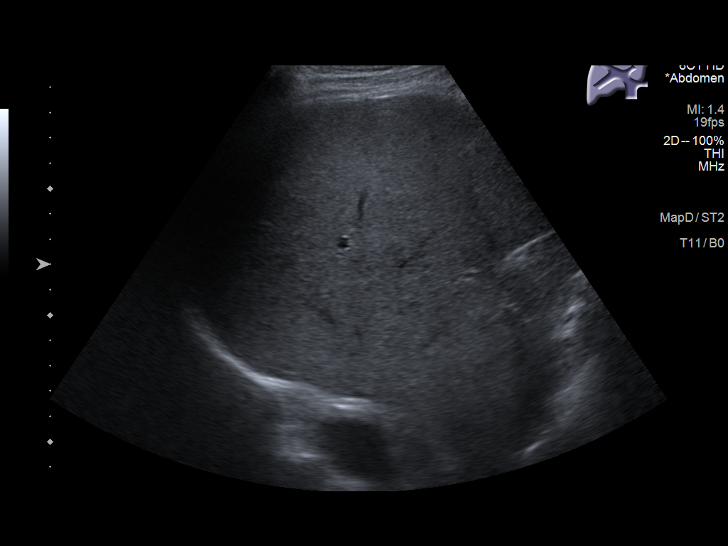
[im 56/61]
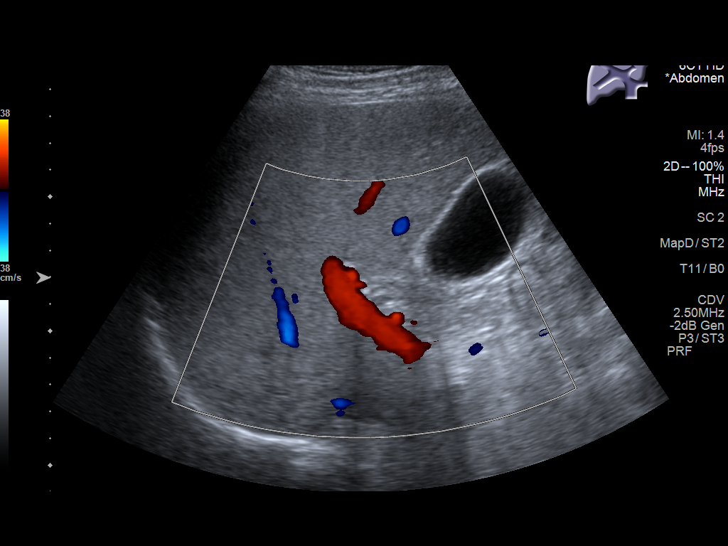
[im 61/61]
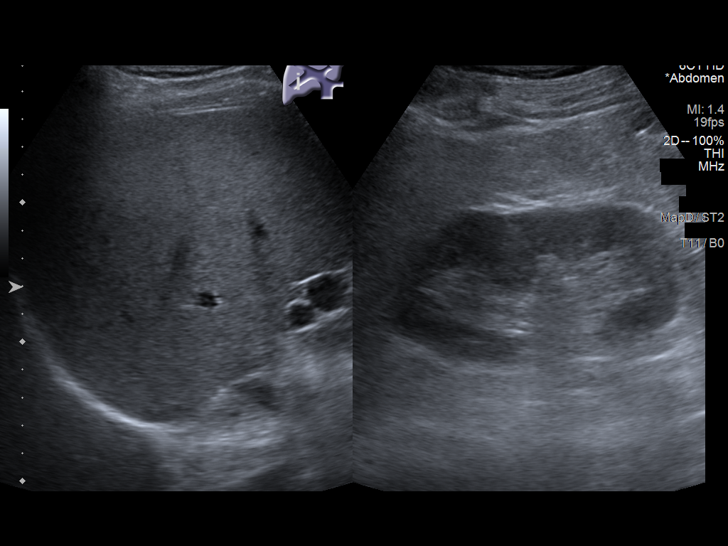

[14 of 25 positions shown; findings below may reference images not displayed]

FINDINGS: Gallbladder:

No gallstones, gallbladder wall thickening, or pericholecystic
fluid. Negative sonographic Murphy's sign.

Common bile duct:

Diameter: 2 mm

Liver:

Coarse hepatic echotexture in this patient with known cirrhosis. No
focal hepatic lesion is seen. Portal vein is patent on color Doppler
imaging with normal direction of blood flow towards the liver.

Other: None.
IMPRESSION: No focal hepatic lesion is seen.

## 2023-01-09 ENCOUNTER — Ambulatory Visit: Payer: Medicare Other | Admitting: Cardiology

## 2023-01-30 IMAGING — US US ABDOMEN LIMITED
1 series · 14 of 25 positions shown · non-contrast
Comparison: Abdominal ultrasound 10/24/2020

CLINICAL DATA: Hepatic cirrhosis HCC screening. Follow-up from
3133.

EXAM:
ULTRASOUND ABDOMEN LIMITED RIGHT UPPER QUADRANT

[Series 1: us abdomen limited · 0.20mm/px · 14 of 51 slices shown]
[im 1/51]
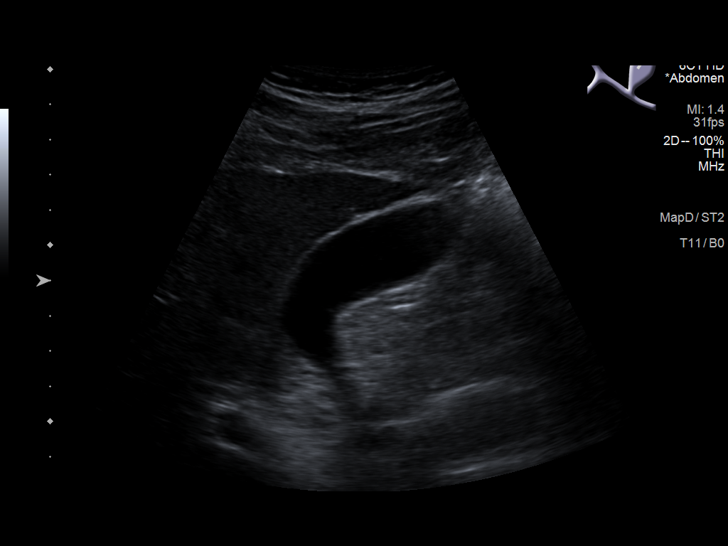
[im 5/51]
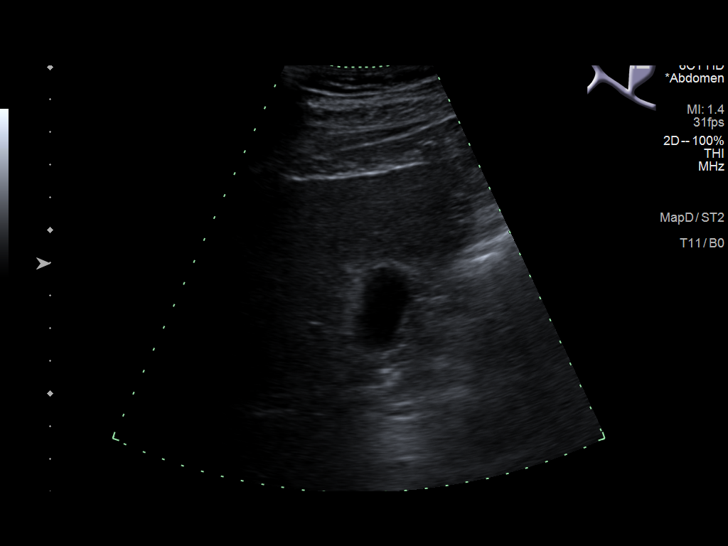
[im 9/51]
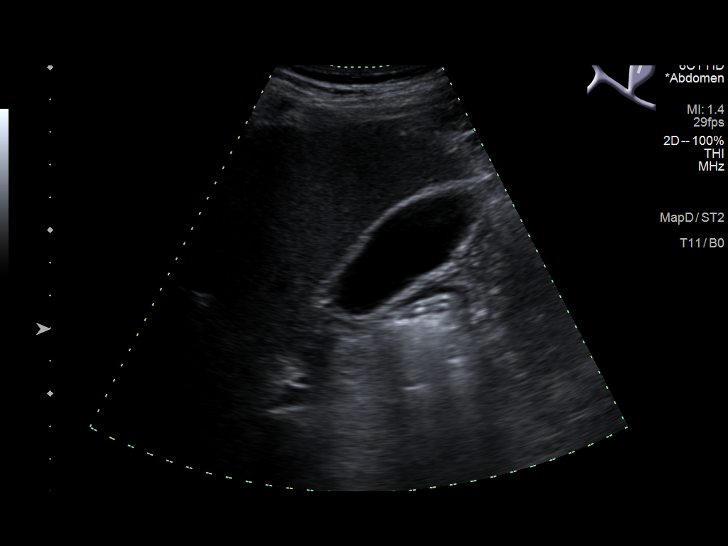
[im 13/51]
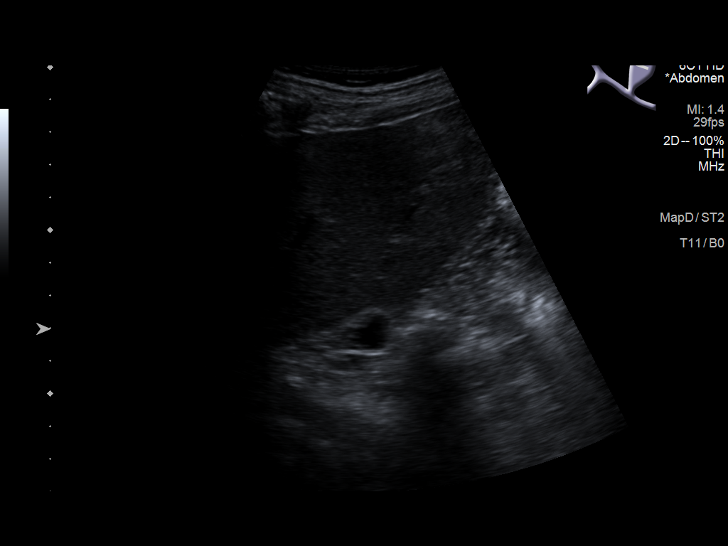
[im 17/51]
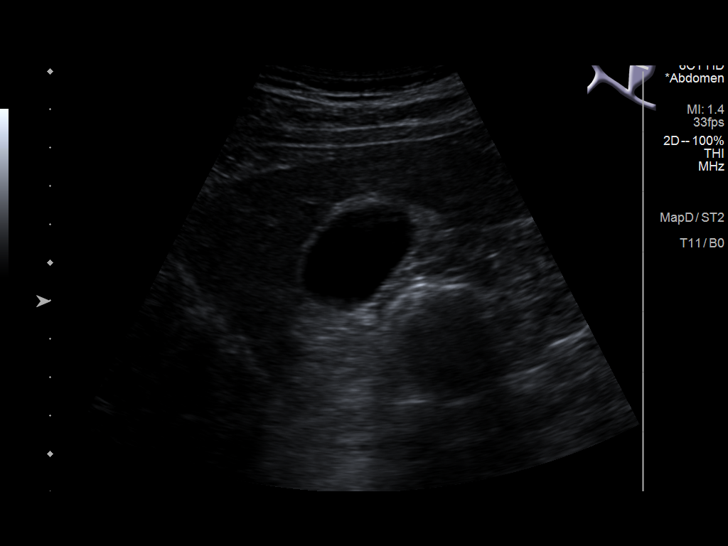
[im 19/51]
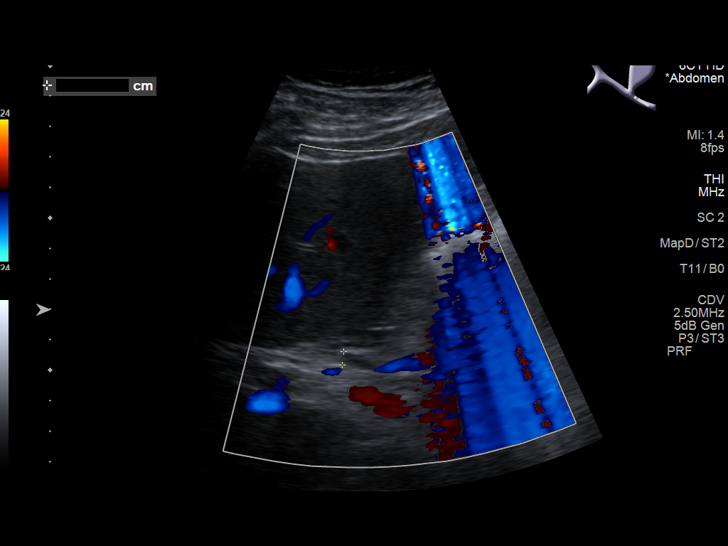
[im 23/51]
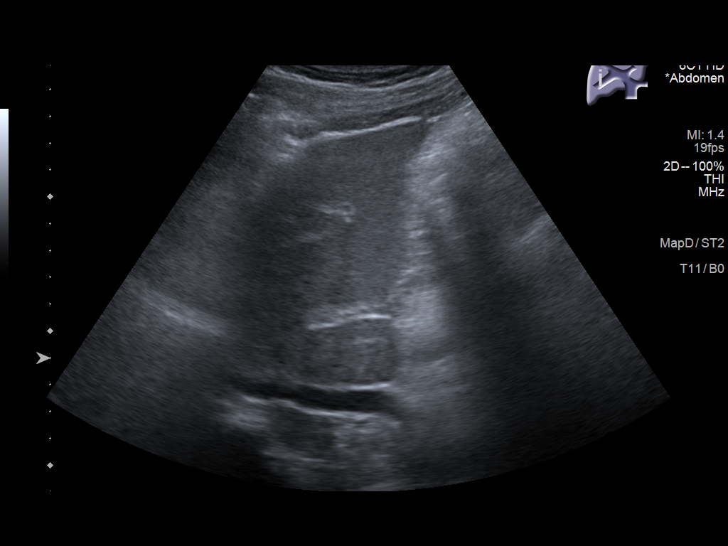
[im 28/51]
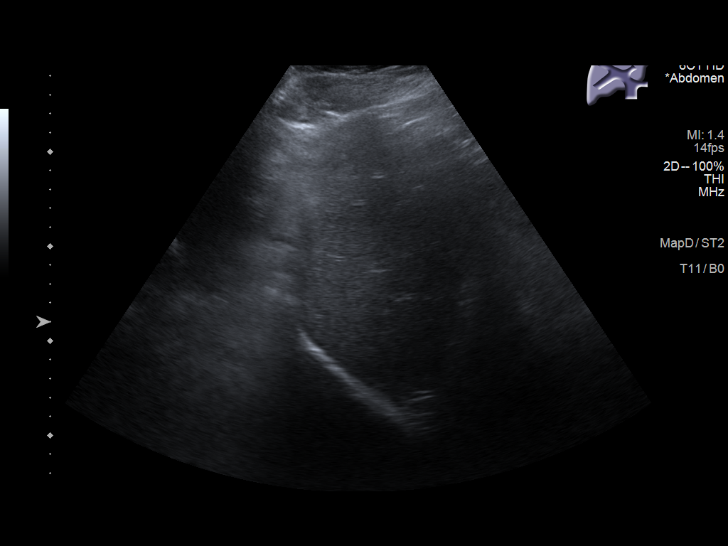
[im 32/51]
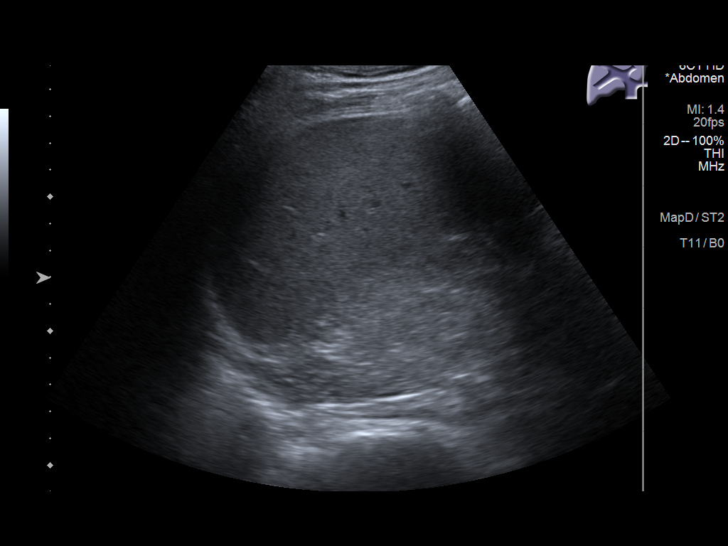
[im 34/51]
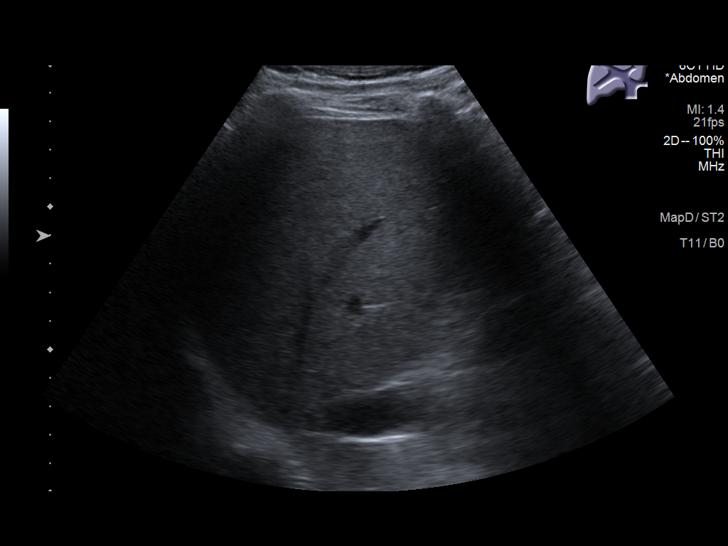
[im 38/51]
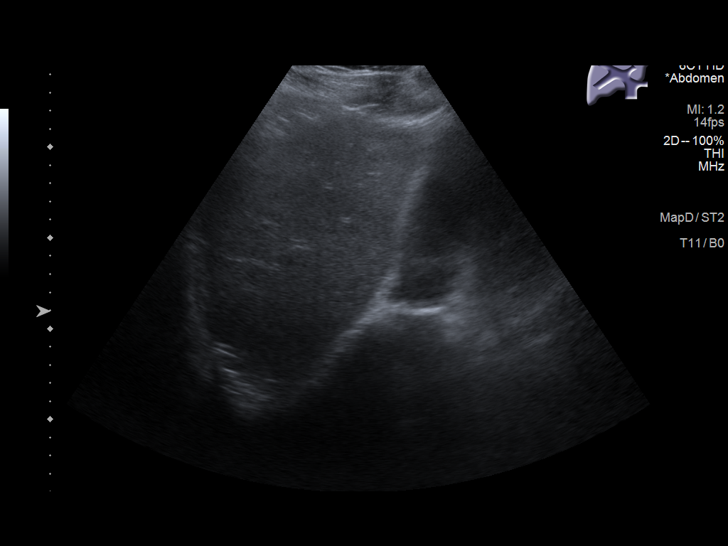
[im 42/51]
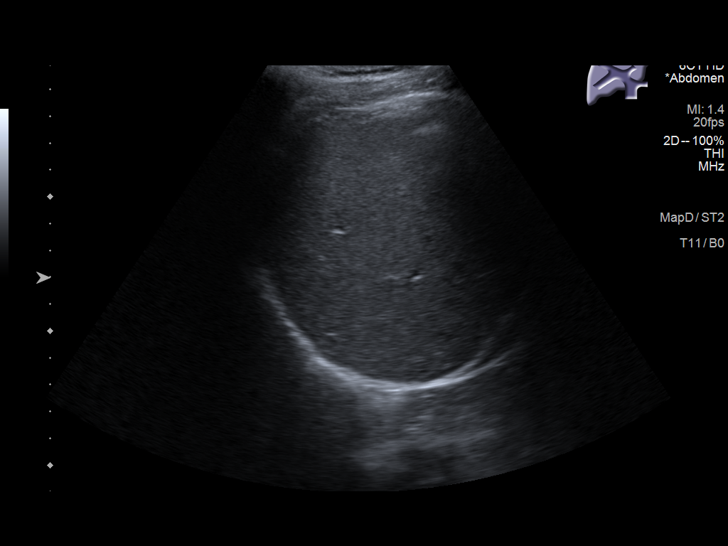
[im 46/51]
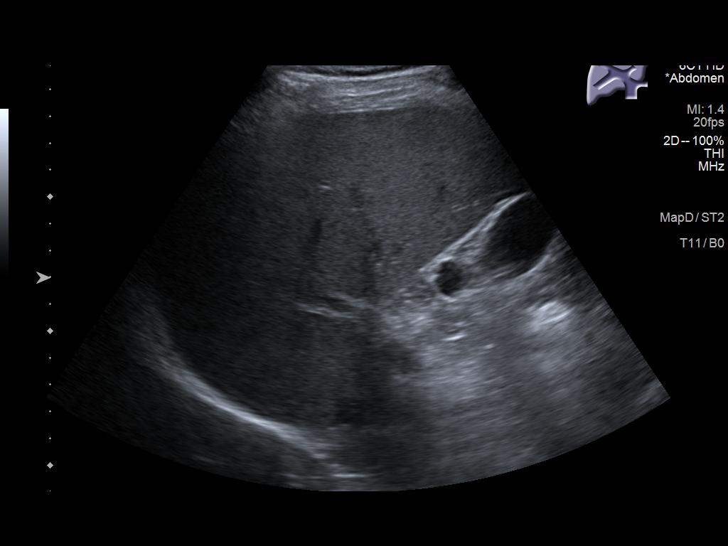
[im 51/51]
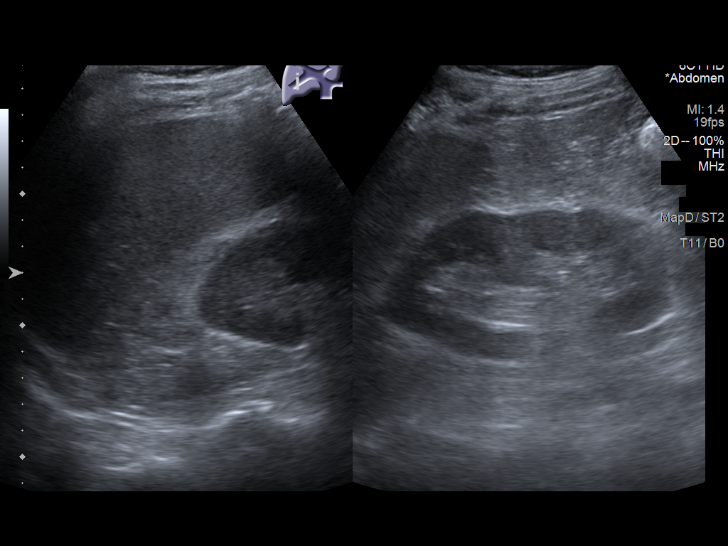

[14 of 25 positions shown; findings below may reference images not displayed]

FINDINGS: Gallbladder:

Physiologically distended. No gallstones or wall thickening
visualized. No sonographic Murphy sign noted by sonographer.

Common bile duct:

Diameter: 4-5 mm, normal.

Liver:

Coarsened and heterogeneous hepatic parenchyma. No definite capsular
nodularity. No focal hepatic lesion. Portal vein is patent on color
Doppler imaging with normal direction of blood flow towards the
liver.

Other: No right upper quadrant ascites
IMPRESSION: No focal hepatic lesion.

## 2023-02-04 ENCOUNTER — Ambulatory Visit: Payer: 59 | Admitting: Cardiology

## 2023-03-27 ENCOUNTER — Ambulatory Visit: Payer: 59 | Admitting: Cardiology

## 2023-05-29 ENCOUNTER — Encounter: Payer: Self-pay | Admitting: Cardiology

## 2023-05-29 ENCOUNTER — Ambulatory Visit: Payer: 59 | Attending: Cardiology | Admitting: Cardiology

## 2023-05-29 VITALS — BP 126/70 | HR 84 | Ht 66.0 in | Wt 204.4 lb

## 2023-05-29 DIAGNOSIS — R0609 Other forms of dyspnea: Secondary | ICD-10-CM

## 2023-05-29 DIAGNOSIS — F17200 Nicotine dependence, unspecified, uncomplicated: Secondary | ICD-10-CM | POA: Diagnosis not present

## 2023-05-29 DIAGNOSIS — IMO0001 Reserved for inherently not codable concepts without codable children: Secondary | ICD-10-CM

## 2023-05-29 DIAGNOSIS — I2089 Other forms of angina pectoris: Secondary | ICD-10-CM

## 2023-05-29 DIAGNOSIS — I10 Essential (primary) hypertension: Secondary | ICD-10-CM | POA: Diagnosis not present

## 2023-05-29 DIAGNOSIS — E7800 Pure hypercholesterolemia, unspecified: Secondary | ICD-10-CM

## 2023-05-29 DIAGNOSIS — R072 Precordial pain: Secondary | ICD-10-CM

## 2023-05-29 MED ORDER — METOPROLOL TARTRATE 100 MG PO TABS: ORAL_TABLET | ORAL | 0 refills | Status: AC

## 2023-05-29 NOTE — Patient Instructions (Addendum)
Medication Instructions:  Your Physician recommend you continue on your current medication as directed.    *If you need a refill on your cardiac medications before your next appointment, please call your pharmacy*   Lab Work: Your provider would like for you to return to have the following labs drawn: (BMP, Lipid).   Please go to the [facility] entrance and check in at the front desk.  You do not need an appointment.  They are open from 7am-6 pm.  You will need to be fasting.  If you have labs (blood work) drawn today and your tests are completely normal, you will receive your results only by: MyChart Message (if you have MyChart) OR A paper copy in the mail If you have any lab test that is abnormal or we need to change your treatment, we will call you to review the results.   Testing/Procedures: Your physician has requested that you have an echocardiogram. Echocardiography is a painless test that uses sound waves to create images of your heart. It provides your doctor with information about the size and shape of your heart and how well your heart's chambers and valves are working.   You may receive an ultrasound enhancing agent through an IV if needed to better visualize your heart during the echo. This procedure takes approximately one hour.  There are no restrictions for this procedure.  This will take place at [address] [facility] [phone], [place] [address]  Cardiac CT Angiography (CTA), is a special type of CT scan that uses a computer to produce multi-dimensional views of major blood vessels throughout the body. In CT angiography, a contrast material is injected through an IV to help visualize the blood vessels  Please see instructions below  Follow-Up: At [facility], you and your health needs are our priority.  As part of our continuing mission to provide you with exceptional heart care, we have created designated Provider Care Teams.  These  Care Teams include your primary Cardiologist (physician) and Advanced Practice Providers (APPs -  Physician Assistants and Nurse Practitioners) who all work together to provide you with the care you need, when you need it.  We recommend signing up for the patient portal called "MyChart".  Sign up information is provided on this After Visit Summary.  MyChart is used to connect with patients for Virtual Visits (Telemedicine).  Patients are able to view lab/test results, encounter notes, upcoming appointments, etc.  Non-urgent messages can be sent to your provider as well.   To learn more about what you can do with MyChart, go to [email or web address].    Your next appointment:   2 month(s)  Provider:   You may see Debbe Odea, MD or one of the following Advanced Practice Providers on your designated Care Team:   Nicolasa Ducking, NP Eula Listen, PA-C Cadence Fransico Michael, PA-C Charlsie Quest, NP      Your cardiac CT will be scheduled at one of the below locations:   [facility] [address] [place], [place] [address] [phone]  OR   [facility] [address] [place], [place] [address] [phone]  If scheduled at [facility], please arrive at the [facility] and Children's Entrance (Entrance C2) of [facility] 30 minutes prior to test start time. You can use the FREE valet parking offered at entrance C (encouraged to control the heart rate for the test)  Proceed to the [facility] Department (first floor) to check-in and test prep.  All radiology patients and guests should use entrance C2 at [facility], accessed from [facility], even though the hospital's physical address listed is [address].    If scheduled at [facility] or [facility], please arrive 15 mins early for check-in and test  prep.   Please follow these instructions carefully (unless otherwise directed):  On the Night Before the Test: Be sure to Drink plenty of water. Do not consume any caffeinated/decaffeinated beverages or chocolate 12 hours prior to your test. Do not take any antihistamines 12 hours prior to your test.  On the Day of the Test: Drink plenty of water until 1 hour prior to the test. Do not eat any food 1 hour prior to test. You may take your regular medications prior to the test.  Take metoprolol (Lopressor) 100 mg  two hours prior to test. If you take Furosemide/Hydrochlorothiazide/Spironolactone, please HOLD on the morning of the test. FEMALES- please wear underwire-free bra if available, avoid dresses & tight clothing       After the Test: Drink plenty of water. After receiving IV contrast, you may experience a mild flushed feeling. This is normal. On occasion, you may experience a mild rash up to 24 hours after the test. This is not dangerous. If this occurs, you can take Benadryl 25 mg and increase your fluid intake. If you experience trouble breathing, this can be serious. If it is severe call 911 IMMEDIATELY. If it is mild, please call our office. If you take any of these medications: Glipizide/Metformin, Avandament, Glucavance, please do not take 48 hours after completing test unless otherwise instructed.  We will call to schedule your test 2-4 weeks out understanding that some insurance companies will need an authorization prior to the service being performed.   For non-scheduling related questions, please contact the cardiac imaging nurse navigator should you have any questions/concerns: Rockwell [place], Cardiac Imaging Nurse Navigator Larey Brick, Cardiac Imaging Nurse Navigator [facility] Direct Office Dial: [phone]   For scheduling needs, including cancellations and rescheduling, please call [place], [phone].

## 2023-05-29 NOTE — Progress Notes (Signed)
Cardiology Office Note:    Date:  05/29/2023   ID:  Athanasius E [place], DOB [birth year], MRN [id number]  PCP:  [facility]   [facility] Providers Cardiologist:  Debbe Odea, MD     Referring MD: Jodi Marble, NP   Chief Complaint  Patient presents with   New Patient (Initial Visit)    Patient reports having dyspnea on exertion that is new this year.  Also trouble with controlling hypertension.  No cardiac history    History of Present Illness:    Issiac E [place] is a 67 y.o. male with a hx of hypertension, hyperlipidemia, current smoker, former cocaine use x 3 years, alcohol dependence, cirrhosis, HIV who presents due to shortness of breath.  Patient states having worsening shortness of breath with exertion ongoing over the past year.  Denies chest pain, denies any history of heart attacks or heart disease.  Denies edema, denies any family history of heart disease.  Compliant with medications as prescribed, also takes aspirin 81 mg daily.  Past Medical History:  Diagnosis Date   Alcohol dependence (HCC)    Cirrhosis of liver (HCC)    dx 2019 by liver bx , last abd ultrasound in epic 10-24-2020  no fibrosis   Cocaine abuse (HCC)    History of anal dysplasia    AIN   History of latent tuberculosis    as teen dx positive ppd and treated for latent tb in [place],  last quatative tn gold bllod test 2018 negative   History of syphilis    treated in 2011   HIV (human immunodeficiency virus infection) (HCC)    followed by [facility] in [place]   Hypertension    followed by pcp   Lipoma    Post herpetic neuralgia    right side ,  takes gabapentin    Past Surgical History:  Procedure Laterality Date   COLONOSCOPY  03/2018   FOREIGN BODY REMOVAL N/A 06/12/2018   Procedure: FOREIGN BODY REMOVAL;  Surgeon: Wyline Mood, MD;  Location: [facility] ENDOSCOPY;  Service: Gastroenterology;  Laterality: N/A;   HIGH RESOLUTION  ANOSCOPY N/A 11/13/2021   Procedure: HIGH RESOLUTION ANOSCOPY;  Surgeon: Romie Levee, MD;  Location: [facility];  Service: General;  Laterality: N/A;   LIPOMA EXCISION Left 11/13/2021   Procedure: EXCISIONAL BIOPSY LEFT POSTERIOR THIGH LIPOMA;  Surgeon: Romie Levee, MD;  Location[facility];  Service: General;  Laterality: Left;    Current Medications: Current Meds  Medication Sig   aspirin EC 81 MG tablet Take 81 mg by mouth daily. Swallow whole.   BIKTARVY 50-200-25 MG TABS tablet Take 1 tablet by mouth daily.   Cholecalciferol (VITAMIN D3) 25 MCG (1000 UT) CAPS Take 1 capsule by mouth daily.   enalapril (VASOTEC) 20 MG tablet Take 20 mg by mouth daily.   gabapentin (NEURONTIN) 600 MG tablet Take 600 mg by mouth 3 (three) times daily.   metoprolol tartrate (LOPRESSOR) 100 MG tablet TAKE 1 TABLET 2 HR PRIOR TO CARDIAC PROCEDURE   omeprazole (PRILOSEC) 20 MG capsule Take 20 mg by mouth daily.   rosuvastatin (CRESTOR) 5 MG tablet Take 5 mg by mouth daily.     Allergies:   Patient has no known allergies.   Social History   Socioeconomic History   Marital status: Single    Spouse name: Not on file   Number of children: Not on file   Years of education: Not on file  Highest education level: Not on file  Occupational History   Not on file  Tobacco Use   Smoking status: Every Day    Packs/day: .25    Types: Cigarettes   Smokeless tobacco: Never   Tobacco comments:    11-11-2021  pt stated 2 cig per day   Vaping Use   Vaping Use: Never used  Substance and Sexual Activity   Alcohol use: Yes    Alcohol/week: 7.0 - 14.0 standard drinks of alcohol    Types: 7 - 14 Cans of beer per week    Comment: 11-11-2021  pt stated 1-2 beer daily (40oz)   Drug use: Not Currently    Types: "Crack" cocaine    Comment: 11-11-2021 pt stated last had crack cocaine 11-01-2021 ,  pt stated told to stop since scheduled for surgery 11-13-2021   Sexual activity:  Not on file  Other Topics Concern   Not on file  Social History Narrative   Not on file   Social Determinants of Health   Financial Resource Strain: Not on file  Food Insecurity: Not on file  Transportation Needs: Not on file  Physical Activity: Not on file  Stress: Not on file  Social Connections: Not on file     Family History: The patient's family history is not on file.  ROS:   Please see the history of present illness.     All other systems reviewed and are negative.  EKGs/Labs/Other Studies Reviewed:    The following studies were reviewed today:   EKG:  EKG is  ordered today.  The ekg ordered today demonstrates normal sinus rhythm, heart rate 84  Recent Labs: No results found for requested labs within last 365 days.  Recent Lipid Panel No results found for: "CHOL", "TRIG", "HDL", "CHOLHDL", "VLDL", "LDLCALC", "LDLDIRECT"   Risk Assessment/Calculations:             Physical Exam:    VS:  BP 126/70 (BP Location: Right Arm, Patient Position: Sitting, Cuff Size: Normal)   Pulse 84   Ht 5\' 6"  (1.676 m)   Wt 204 lb 6.4 oz (92.7 kg)   SpO2 99%   BMI 32.99 kg/m     Wt Readings from Last 3 Encounters:  05/29/23 204 lb 6.4 oz (92.7 kg)  11/13/21 201 lb (91.2 kg)  06/12/18 205 lb (93 kg)     GEN:  Well nourished, well developed in no acute distress HEENT: Normal NECK: No JVD; No carotid bruits CARDIAC: RRR, no murmurs, rubs, gallops RESPIRATORY:  Clear to auscultation without rales, wheezing or rhonchi  ABDOMEN: Soft, non-tender, non-distended MUSCULOSKELETAL:  No edema; No deformity  SKIN: Warm and dry NEUROLOGIC:  Alert and oriented x 3 PSYCHIATRIC:  Normal affect   ASSESSMENT:    1. Dyspnea on exertion   2. Primary hypertension   3. Pure hypercholesterolemia   4. Smoking   5. Anginal equivalent   6. Precordial pain    PLAN:    In order of problems listed above:  Dyspnea on exertion, this could be an anginal equivalent, has several risk  factors.  Get echocardiogram, get coronary CTA. Hypertension, BP controlled.  Continue enalapril 20 mg daily Hyperlipidemia, continue Crestor, obtain lipid profile. Current smoker, smoking cessation advised.   Follow-up after cardiac testing.      Medication Adjustments/Labs and Tests Ordered: Current medicines are reviewed at length with the patient today.  Concerns regarding medicines are outlined above.  Orders Placed This Encounter  Procedures  CT CORONARY MORPH W/CTA COR W/SCORE W/CA W/CM &/OR WO/CM   Lipid panel   Basic metabolic panel   EKG 12-Lead   ECHOCARDIOGRAM COMPLETE   Meds ordered this encounter  Medications   metoprolol tartrate (LOPRESSOR) 100 MG tablet    Sig: TAKE 1 TABLET 2 HR PRIOR TO CARDIAC PROCEDURE    Dispense:  1 tablet    Refill:  0    Patient Instructions  Medication Instructions:  Your Physician recommend you continue on your current medication as directed.    *If you need a refill on your cardiac medications before your next appointment, please call your pharmacy*   Lab Work: Your provider would like for you to return to have the following labs drawn: (BMP, Lipid).   Please go to the [facility] entrance and check in at the front desk.  You do not need an appointment.  They are open from 7am-6 pm.  You will need to be fasting.  If you have labs (blood work) drawn today and your tests are completely normal, you will receive your results only by: MyChart Message (if you have MyChart) OR A paper copy in the mail If you have any lab test that is abnormal or we need to change your treatment, we will call you to review the results.   Testing/Procedures: Your physician has requested that you have an echocardiogram. Echocardiography is a painless test that uses sound waves to create images of your heart. It provides your doctor with information about the size and shape of your heart and how well your heart's chambers and valves are  working.   You may receive an ultrasound enhancing agent through an IV if needed to better visualize your heart during the echo. This procedure takes approximately one hour.  There are no restrictions for this procedure.  This will take place at [address] ([facility]) [phone], [place] [address]  Cardiac CT Angiography (CTA), is a special type of CT scan that uses a computer to produce multi-dimensional views of major blood vessels throughout the body. In CT angiography, a contrast material is injected through an IV to help visualize the blood vessels  Please see instructions below  Follow-Up: At [facility], you and your health needs are our priority.  As part of our continuing mission to provide you with exceptional heart care, we have created designated Provider Care Teams.  These Care Teams include your primary Cardiologist (physician) and Advanced Practice Providers (APPs -  Physician Assistants and Nurse Practitioners) who all work together to provide you with the care you need, when you need it.  We recommend signing up for the patient portal called "MyChart".  Sign up information is provided on this After Visit Summary.  MyChart is used to connect with patients for Virtual Visits (Telemedicine).  Patients are able to view lab/test results, encounter notes, upcoming appointments, etc.  Non-urgent messages can be sent to your provider as well.   To learn more about what you can do with MyChart, go to [email or web address].    Your next appointment:   2 month(s)  Provider:   You may see Debbe Odea, MD or one of the following Advanced Practice Providers on your designated Care Team:   Nicolasa Ducking, NP Eula Listen, PA-C Cadence Fransico Michael, PA-C Charlsie Quest, NP      Your cardiac CT will be scheduled at one of the below locations:   [facility] [address]  [place], [place] [address] [phone]  OR   [facility] [address] [place], [place] [address] [phone]  If scheduled at [facility], please arrive at the [facility] and Children's Entrance (Entrance C2) of [facility] 30 minutes prior to test start time. You can use the FREE valet parking offered at entrance C (encouraged to control the heart rate for the test)  Proceed to the [facility] Radiology Department (first floor) to check-in and test prep.  All radiology patients and guests should use entrance C2 at [facility], accessed from [facility], even though the hospital's physical address listed is [address].    If scheduled at [facility] or [facility], please arrive 15 mins early for check-in and test prep.   Please follow these instructions carefully (unless otherwise directed):  On the Night Before the Test: Be sure to Drink plenty of water. Do not consume any caffeinated/decaffeinated beverages or chocolate 12 hours prior to your test. Do not take any antihistamines 12 hours prior to your test.  On the Day of the Test: Drink plenty of water until 1 hour prior to the test. Do not eat any food 1 hour prior to test. You may take your regular medications prior to the test.  Take metoprolol (Lopressor) 100 mg  two hours prior to test. If you take Furosemide/Hydrochlorothiazide/Spironolactone, please HOLD on the morning of the test. FEMALES- please wear underwire-free bra if available, avoid dresses & tight clothing       After the Test: Drink plenty of water. After receiving IV contrast, you may experience a mild flushed feeling. This is normal. On occasion, you may experience a mild rash up to 24 hours after the test. This is not dangerous. If this occurs, you can take Benadryl 25 mg and increase your fluid intake. If you experience trouble breathing, this can be serious. If it  is severe call 911 IMMEDIATELY. If it is mild, please call our office. If you take any of these medications: Glipizide/Metformin, Avandament, Glucavance, please do not take 48 hours after completing test unless otherwise instructed.  We will call to schedule your test 2-4 weeks out understanding that some insurance companies will need an authorization prior to the service being performed.   For non-scheduling related questions, please contact the cardiac imaging nurse navigator should you have any questions/concerns: Rockwell Alexandria, Cardiac Imaging Nurse Navigator Larey Brick, Cardiac Imaging Nurse Navigator [facility] Direct Office Dial: [phone]   For scheduling needs, including cancellations and rescheduling, please call [place], [phone].    Signed, Debbe Odea, MD  05/29/2023 3:36 PM    [facility]

## 2023-06-03 ENCOUNTER — Other Ambulatory Visit: Payer: Self-pay | Admitting: Nurse Practitioner

## 2023-06-03 DIAGNOSIS — K7460 Unspecified cirrhosis of liver: Secondary | ICD-10-CM

## 2023-06-26 ENCOUNTER — Ambulatory Visit: Payer: 59

## 2023-07-06 ENCOUNTER — Telehealth (HOSPITAL_COMMUNITY): Payer: Self-pay | Admitting: Emergency Medicine

## 2023-07-06 NOTE — Telephone Encounter (Signed)
Attempted to call patient regarding upcoming cardiac CT appointment. °Left message on voicemail with name and callback number °Courtne Lighty RN Navigator Cardiac Imaging °[facility] °[phone] Office °[phone] Cell ° °

## 2023-07-07 ENCOUNTER — Encounter (HOSPITAL_COMMUNITY): Payer: Self-pay | Admitting: *Deleted

## 2023-07-07 ENCOUNTER — Telehealth (HOSPITAL_COMMUNITY): Payer: Self-pay | Admitting: Emergency Medicine

## 2023-07-07 NOTE — Telephone Encounter (Signed)
Attempted to call patient regarding upcoming cardiac CT appointment. °Left message on voicemail with name and callback number °Christopher Glasscock RN Navigator Cardiac Imaging °[facility] °[phone] Office °[phone] Cell ° °

## 2023-07-08 ENCOUNTER — Ambulatory Visit: Payer: 59

## 2023-07-13 ENCOUNTER — Ambulatory Visit: Payer: 59

## 2023-07-15 ENCOUNTER — Other Ambulatory Visit: Payer: 59

## 2023-07-21 ENCOUNTER — Ambulatory Visit
Admission: RE | Admit: 2023-07-21 | Discharge: 2023-07-21 | Disposition: A | Discharge status: Home / Self Care | Payer: 59 | Source: Ambulatory Visit | Attending: Nurse Practitioner | Admitting: Nurse Practitioner

## 2023-07-21 DIAGNOSIS — K7460 Unspecified cirrhosis of liver: Secondary | ICD-10-CM | POA: Diagnosis present

## 2023-07-23 ENCOUNTER — Telehealth: Payer: Self-pay | Admitting: Cardiology

## 2023-07-23 NOTE — Telephone Encounter (Signed)
Patient wants to reschedule his 7/26 echocardiogram to the end of August and will need orders extended.

## 2023-07-24 ENCOUNTER — Ambulatory Visit: Payer: 59

## 2023-07-29 ENCOUNTER — Telehealth (HOSPITAL_COMMUNITY): Payer: Self-pay | Admitting: Emergency Medicine

## 2023-07-29 NOTE — Telephone Encounter (Signed)
Attempted to call patient regarding upcoming cardiac CT appointment. °Left message on voicemail with name and callback number °Sara Wallace RN Navigator Cardiac Imaging °[facility] °[phone] Office °[phone] Cell ° °

## 2023-07-30 ENCOUNTER — Ambulatory Visit: Admission: RE | Admit: 2023-07-30 | Payer: 59 | Source: Ambulatory Visit

## 2023-07-31 ENCOUNTER — Telehealth: Payer: Self-pay | Admitting: Cardiology

## 2023-07-31 NOTE — Telephone Encounter (Signed)
-----   Message from [facility] Jasmine December C sent at 07/31/2023  9:17 AM EDT ----- Patient needs to be r/s from Aug. 5th  with Dr. Azucena Cecil for after the CT scan and Echo. The CT is scheduled for 09/03/23 & Echo 08/26/23.    Thanks, Jasmine December ;)

## 2023-07-31 NOTE — Telephone Encounter (Signed)
Left message on voice mail to reschedule after testing

## 2023-08-03 ENCOUNTER — Ambulatory Visit: Payer: 59 | Admitting: Cardiology

## 2023-08-26 ENCOUNTER — Ambulatory Visit: Payer: 59 | Attending: Cardiology

## 2023-09-02 ENCOUNTER — Telehealth (HOSPITAL_COMMUNITY): Payer: Self-pay | Admitting: *Deleted

## 2023-09-02 NOTE — Telephone Encounter (Signed)
Calling patient about his cardiac CT scan appointment.  Patient expresses frustrations with changes to his appointment time.  He states that he wishes to do this CT scan next year. Informed him that I will cancel his appointment and let Dr. Azucena Cecil know about his wishes. He verbalized understanding.  Larey Brick RN Navigator Cardiac Imaging [facility] [phone] Office [phone] Cell

## 2023-09-03 ENCOUNTER — Ambulatory Visit: Admission: RE | Admit: 2023-09-03 | Payer: 59 | Source: Ambulatory Visit

## 2023-10-26 ENCOUNTER — Encounter: Payer: Self-pay | Admitting: Cardiology

## 2024-04-19 ENCOUNTER — Other Ambulatory Visit: Payer: Self-pay | Admitting: Nurse Practitioner

## 2024-04-19 DIAGNOSIS — K7460 Unspecified cirrhosis of liver: Secondary | ICD-10-CM

## 2024-04-25 ENCOUNTER — Ambulatory Visit

## 2024-05-28 ENCOUNTER — Other Ambulatory Visit: Payer: Self-pay

## 2024-05-28 ENCOUNTER — Emergency Department
Admission: EM | Admit: 2024-05-28 | Discharge: 2024-05-28 | Disposition: A | Discharge status: Home / Self Care | Attending: Emergency Medicine | Admitting: Emergency Medicine

## 2024-05-28 DIAGNOSIS — Z23 Encounter for immunization: Secondary | ICD-10-CM | POA: Insufficient documentation

## 2024-05-28 DIAGNOSIS — I10 Essential (primary) hypertension: Secondary | ICD-10-CM | POA: Diagnosis not present

## 2024-05-28 DIAGNOSIS — Z21 Asymptomatic human immunodeficiency virus [HIV] infection status: Secondary | ICD-10-CM | POA: Insufficient documentation

## 2024-05-28 DIAGNOSIS — W540XXA Bitten by dog, initial encounter: Secondary | ICD-10-CM | POA: Diagnosis not present

## 2024-05-28 DIAGNOSIS — Z2914 Encounter for prophylactic rabies immune globin: Secondary | ICD-10-CM | POA: Insufficient documentation

## 2024-05-28 DIAGNOSIS — S31159A Open bite of abdominal wall, unspecified quadrant without penetration into peritoneal cavity, initial encounter: Secondary | ICD-10-CM | POA: Insufficient documentation

## 2024-05-28 DIAGNOSIS — Z203 Contact with and (suspected) exposure to rabies: Secondary | ICD-10-CM | POA: Diagnosis not present

## 2024-05-28 DIAGNOSIS — S3991XA Unspecified injury of abdomen, initial encounter: Secondary | ICD-10-CM | POA: Diagnosis present

## 2024-05-28 MED ORDER — TETANUS-DIPHTH-ACELL PERTUSSIS 5-2.5-18.5 LF-MCG/0.5 IM SUSY
0.5000 mL | PREFILLED_SYRINGE | Freq: Once | INTRAMUSCULAR | Status: AC
  Administered 2024-05-28: 0.5 mL via INTRAMUSCULAR
  Filled 2024-05-28: qty 0.5

## 2024-05-28 MED ORDER — RABIES VACCINE, PCEC IM SUSR
1.0000 mL | Freq: Once | INTRAMUSCULAR | Status: AC
  Administered 2024-05-28: 1 mL via INTRAMUSCULAR
  Filled 2024-05-28: qty 1

## 2024-05-28 MED ORDER — AMOXICILLIN-POT CLAVULANATE 875-125 MG PO TABS: 1.0000 | ORAL_TABLET | Freq: Two times a day (BID) | ORAL | 0 refills | Status: AC

## 2024-05-28 MED ORDER — RABIES IMMUNE GLOBULIN 300 UNIT/2ML IJ SOLN
20.0000 [IU]/kg | Freq: Once | INTRAMUSCULAR | Status: AC
  Administered 2024-05-28: 1800 [IU]
  Filled 2024-05-28: qty 2

## 2024-05-28 NOTE — ED Triage Notes (Signed)
 Pt reports being bit by a dog on his abdomen yesterday while walking down the street near his home. Bleeding is controlled. Pt does not know who the dog belongs to. No pain or other c/o at this time.

## 2024-05-28 NOTE — Discharge Instructions (Signed)
 Go to urgent care listed above on June 3, June 7, and June 14 for the rest of the rabies series.  Come back to the emergency department if you have signs or concerns for infection around the dog bite.

## 2024-05-28 NOTE — ED Provider Notes (Signed)
 [facility] Provider Note    Event Date/Time   First MD Initiated Contact with Patient 05/28/24 1017     (approximate)   History   Animal Bite   HPI  Richard Rowe is a 68 y.o. male  HIV, HTN, liver cirrhosis, cocaine abuse, alcohol dependence and as listed in EMR presents to the emergency department for treatment and evaluation after dog bit that occurred yesterday. He is unsure who the dog belongs to. He states he was walking down the street and heard some leaves rustle and turned around to see and the dog lunged at him and bit him in the abdomen. He was able to kick it to get it to run away. Animal control notified.Aaron Aas      Physical Exam   Triage Vital Signs: ED Triage Vitals [05/28/24 1016]  Encounter Vitals Group     BP 138/79     Systolic BP Percentile      Diastolic BP Percentile      Pulse Rate 97     Resp 16     Temp 98 F (36.7 C)     Temp Source Oral     SpO2 98 %     Weight 187 lb (84.8 kg)     Height 5\' 6"  (1.676 m)     Head Circumference      Peak Flow      Pain Score 0     Pain Loc      Pain Education      Exclude from Growth Chart     Most recent vital signs: Vitals:   05/28/24 1016  BP: 138/79  Pulse: 97  Resp: 16  Temp: 98 F (36.7 C)  SpO2: 98%    General: Awake, no distress.  CV:  Good peripheral perfusion.  Resp:  Normal effort.  Abd:  No distention.  Other:  Abrasions and a puncture wound noted to the right lower abdomen with surrounding ecchymosis   ED Results / Procedures / Treatments   Labs (all labs ordered are listed, but only abnormal results are displayed) Labs Reviewed - No data to display   EKG  Not indicated.   RADIOLOGY  Image and radiology report reviewed and interpreted by me. Radiology report consistent with the same.  Not indicated.  PROCEDURES:  Critical Care performed: No  Procedures   MEDICATIONS ORDERED IN ED:  Medications  rabies immune globulin (HYPERRAB)  injection 1,800 Units (has no administration in time range)  rabies vaccine (RABAVERT) injection 1 mL (has no administration in time range)  Tdap (BOOSTRIX) injection 0.5 mL (has no administration in time range)     IMPRESSION / MDM / ASSESSMENT AND PLAN / ED COURSE   I have reviewed the triage note.  Differential diagnosis includes, but is not limited to, Dog bite, rabies exposure, cellulitis  Patient's presentation is most consistent with acute illness / injury with system symptoms.  68 year old male presenting to the emergency department the day after being bitten by an unfamiliar dog yesterday.  See HPI for further details.  He states he has cleaned the wound with peroxide and alcohol.  He reports that the pain is minimal.  He is unsure if he would be able to identify the dog even if the police/male control are able to find it.  We discussed need for initiation of rabies series. Tdap will also be updated today.   Augmenting sent to the pharmacy. He is to return to the ER for  any concern of infection.  Dates to go to urgent care for the remainder of the rabies series was written on his discharge paperwork and verbally discussed.       FINAL CLINICAL IMPRESSION(S) / ED DIAGNOSES   Final diagnoses:  Dog bite of abdomen  Need for post exposure prophylaxis for rabies     Rx / DC Orders   ED Discharge Orders          Ordered    amoxicillin-clavulanate (AUGMENTIN) 875-125 MG tablet  2 times daily        05/28/24 1118             Note:  This document was prepared using Dragon voice recognition software and may include unintentional dictation errors.   Sherryle Don, FNP 05/28/24 1130    Lind Repine, MD 05/28/24 [phone]

## 2024-05-28 NOTE — ED Notes (Signed)
 Animal Control has been contacted

## 2024-05-31 ENCOUNTER — Ambulatory Visit
Admission: EM | Admit: 2024-05-31 | Discharge: 2024-05-31 | Disposition: A | Discharge status: Home / Self Care | Attending: Emergency Medicine | Admitting: Emergency Medicine

## 2024-05-31 ENCOUNTER — Other Ambulatory Visit: Payer: Self-pay

## 2024-05-31 ENCOUNTER — Encounter: Payer: Self-pay | Admitting: Emergency Medicine

## 2024-05-31 ENCOUNTER — Emergency Department
Admission: EM | Admit: 2024-05-31 | Discharge: 2024-05-31 | Disposition: A | Discharge status: Against Medical Advice | Attending: Emergency Medicine | Admitting: Emergency Medicine

## 2024-05-31 DIAGNOSIS — Z2914 Encounter for prophylactic rabies immune globin: Secondary | ICD-10-CM | POA: Insufficient documentation

## 2024-05-31 DIAGNOSIS — Z5321 Procedure and treatment not carried out due to patient leaving prior to being seen by health care provider: Secondary | ICD-10-CM | POA: Insufficient documentation

## 2024-05-31 DIAGNOSIS — Z203 Contact with and (suspected) exposure to rabies: Secondary | ICD-10-CM | POA: Diagnosis not present

## 2024-05-31 DIAGNOSIS — Z23 Encounter for immunization: Secondary | ICD-10-CM | POA: Diagnosis not present

## 2024-05-31 MED ORDER — RABIES VACCINE, PCEC IM SUSR
1.0000 mL | Freq: Once | INTRAMUSCULAR | Status: AC
  Administered 2024-05-31: 1 mL via INTRAMUSCULAR

## 2024-05-31 NOTE — ED Triage Notes (Signed)
 Patient to ED via POV for rabies injection. Had 1st round of shots on 5/31.

## 2024-05-31 NOTE — ED Triage Notes (Signed)
 Patient presents for 2nd rabies vaccine, tolerated previous well.

## 2024-06-04 ENCOUNTER — Ambulatory Visit
Admission: EM | Admit: 2024-06-04 | Discharge: 2024-06-04 | Disposition: A | Discharge status: Home / Self Care | Attending: Emergency Medicine | Admitting: Emergency Medicine

## 2024-06-04 DIAGNOSIS — Z23 Encounter for immunization: Secondary | ICD-10-CM

## 2024-06-04 MED ORDER — RABIES VACCINE, PCEC IM SUSR
1.0000 mL | Freq: Once | INTRAMUSCULAR | Status: AC
  Administered 2024-06-04: 1 mL via INTRAMUSCULAR

## 2024-06-04 NOTE — ED Triage Notes (Signed)
 Patient presents to UC for day 7 dose. Tolerated previous doses well.

## 2024-06-11 ENCOUNTER — Ambulatory Visit
Admission: EM | Admit: 2024-06-11 | Discharge: 2024-06-11 | Disposition: A | Discharge status: Home / Self Care | Attending: Emergency Medicine | Admitting: Emergency Medicine

## 2024-06-11 DIAGNOSIS — Z203 Contact with and (suspected) exposure to rabies: Secondary | ICD-10-CM | POA: Insufficient documentation

## 2024-06-11 DIAGNOSIS — Z23 Encounter for immunization: Secondary | ICD-10-CM

## 2024-06-11 MED ORDER — RABIES VACCINE, PCEC IM SUSR
1.0000 mL | Freq: Once | INTRAMUSCULAR | Status: AC
  Administered 2024-06-11: 1 mL via INTRAMUSCULAR

## 2024-06-11 NOTE — ED Triage Notes (Signed)
 Patient here for day 14 rabies vaccine .   Tolerates other vaccines well. Denies any other complaints.

## 2024-06-15 ENCOUNTER — Ambulatory Visit

## 2024-06-15 ENCOUNTER — Other Ambulatory Visit

## 2024-06-24 ENCOUNTER — Ambulatory Visit
Admission: RE | Admit: 2024-06-24 | Discharge: 2024-06-24 | Disposition: A | Discharge status: Home / Self Care | Source: Ambulatory Visit | Attending: Nurse Practitioner | Admitting: Nurse Practitioner

## 2024-06-24 DIAGNOSIS — K7460 Unspecified cirrhosis of liver: Secondary | ICD-10-CM | POA: Diagnosis present

## 2024-08-24 ENCOUNTER — Other Ambulatory Visit: Payer: Self-pay | Admitting: Surgery

## 2024-09-02 ENCOUNTER — Encounter
Admission: RE | Admit: 2024-09-02 | Discharge: 2024-09-02 | Disposition: A | Discharge status: Home / Self Care | Source: Ambulatory Visit | Attending: Surgery | Admitting: Surgery

## 2024-09-02 ENCOUNTER — Other Ambulatory Visit: Payer: Self-pay

## 2024-09-02 VITALS — Ht 69.0 in | Wt 180.0 lb

## 2024-09-02 DIAGNOSIS — I10 Essential (primary) hypertension: Secondary | ICD-10-CM

## 2024-09-02 HISTORY — DX: Gastro-esophageal reflux disease without esophagitis: K21.9

## 2024-09-02 NOTE — Patient Instructions (Addendum)
 Your procedure is scheduled nw:Uylmdijb 09/08/24 Report to the Registration Desk on the 1st floor of the [facility]. To find out your arrival time, please call [phone] between 1PM - 3PM on: Wednesday 09/07/24 If your arrival time is 6:00 am, do not arrive before that time as the [facility] entrance doors do not open until 6:00 am.  REMEMBER: Instructions that are not followed completely may result in serious medical risk, up to and including death; or upon the discretion of your surgeon and anesthesiologist your surgery may need to be rescheduled.  Do not eat food after midnight the night before surgery.  No gum chewing or hard candies.  You may however, drink CLEAR liquids up to 2 hours before you are scheduled to arrive for your surgery. Do not drink anything within 2 hours of your scheduled arrival time.  Clear liquids include: - water  - apple juice without pulp - gatorade (not RED colors) - black coffee or tea (Do NOT add milk or creamers to the coffee or tea) Do NOT drink anything that is not on this list.  One week prior to surgery: Stop Anti-inflammatories (NSAIDS) such as Advil, Aleve, Ibuprofen, Motrin, Naproxen, Naprosyn and Aspirin based products such as Excedrin, Goody's Powder, BC Powder.  You may however, continue to take Tylenol  if needed for pain up until the day of surgery.  Stop ANY OVER THE COUNTER supplements and vitamins until after surgery.  Continue taking all of your other prescription medications up until the day of surgery.  ON THE DAY OF SURGERY ONLY TAKE THESE MEDICATIONS WITH SIPS OF WATER:  BIKTARVY 50-200-25 MG TABS tablet  gabapentin (NEURONTIN) 600 MG tablet  omeprazole (PRILOSEC) 20 MG capsule  rosuvastatin (CRESTOR) 5 MG tablet   No Alcohol for 24 hours before or after surgery.  No Smoking including e-cigarettes for 24 hours before surgery.  No chewable tobacco products for at least 6 hours before surgery.  No nicotine patches on  the day of surgery.  Do not use any recreational drugs for at least a week (preferably 2 weeks) before your surgery.  Please be advised that the combination of cocaine and anesthesia may have negative outcomes, up to and including death. If you test positive for cocaine, your surgery will be cancelled.  On the morning of surgery brush your teeth with toothpaste and water, you may rinse your mouth with mouthwash if you wish. Do not swallow any toothpaste or mouthwash.  Use CHG Soap or wipes as directed on instruction sheet.  Do not wear lotions, powders, deodorant or perfumes.  Do not shave body hair from the neck down 48 hours before surgery.  Wear comfortable clothing (specific to your surgery type) to the hospital  Do not wear jewelry, make-up, hairpins, clips or nail polish.  For welded (permanent) jewelry: bracelets, anklets, waist bands, etc.  Please have this removed prior to surgery.  If it is not removed, there is a chance that hospital personnel will need to cut it off on the day of surgery.  Contact lenses, hearing aids and dentures may not be worn into surgery.  Do not bring valuables to the hospital. [facility] is not responsible for any missing/lost belongings or valuables.   Notify your doctor if there is any change in your medical condition (cold, fever, infection).  After surgery, you can help prevent lung complications by doing breathing exercises. Take deep breaths and cough every 1-2 hours. Your doctor may order a device called an Incentive  Spirometer to help you take deep breaths.  If you are being discharged the day of surgery, you will not be allowed to drive home. You will need a responsible individual to drive you home and stay with you for 24 hours after surgery.   Please call the Pre-admissions Testing Dept. at [phone] if you have any questions about these instructions.  Surgery Visitation Policy:  Patients having surgery or a procedure may have  two visitors.  Children under the age of 82 must have an adult with them who is not the patient.   Merchandiser, retail to address health-related social needs:  [email or web address]                                                                                                             Preparing for Surgery with CHLORHEXIDINE GLUCONATE (CHG) Soap  Chlorhexidine Gluconate (CHG) Soap  o An antiseptic cleaner that kills germs and bonds with the skin to continue killing germs even after washing  o Used for showering the night before surgery and morning of surgery  Before surgery, you can play an important role by reducing the number of germs on your skin.  CHG (Chlorhexidine gluconate) soap is an antiseptic cleanser which kills germs and bonds with the skin to continue killing germs even after washing.  Please do not use if you have an allergy to CHG or antibacterial soaps. If your skin becomes reddened/irritated stop using the CHG.  1. Shower the NIGHT BEFORE SURGERY and the MORNING OF SURGERY with CHG soap.  2. If you choose to wash your hair, wash your hair first as usual with your normal shampoo.  3. After shampooing, rinse your hair and body thoroughly to remove the shampoo.  4. Use CHG as you would any other liquid soap. You can apply CHG directly to the skin and wash gently with a scrungie or a clean washcloth.  5. Apply the CHG soap to your body only from the neck down. Do not use on open wounds or open sores. Avoid contact with your eyes, ears, mouth, and genitals (private parts). Wash face and genitals (private parts) with your normal soap.  6. Wash thoroughly, paying special attention to the area where your surgery will be performed.  7. Thoroughly rinse your body with warm water.  8. Do not shower/wash with your normal soap after using and rinsing off the CHG soap.  9. Pat yourself dry with a clean towel.  10. Wear clean pajamas to bed the night  before surgery.  12. Place clean sheets on your bed the night of your first shower and do not sleep with pets.  13. Shower again with the CHG soap on the day of surgery prior to arriving at the hospital.  14. Do not apply any deodorants/lotions/powders.  15. Please wear clean clothes to the hospital.  How to Use an Incentive Spirometer  An incentive spirometer is a tool that measures how well you are filling your lungs with each breath. Learning to take long, deep breaths  using this tool can help you keep your lungs clear and active. This may help to reverse or lessen your chance of developing breathing (pulmonary) problems, especially infection. You may be asked to use a spirometer: After a surgery. If you have a lung problem or a history of smoking. After a long period of time when you have been unable to move or be active. If the spirometer includes an indicator to show the highest number that you have reached, your health care provider or respiratory therapist will help you set a goal. Keep a log of your progress as told by your health care provider. What are the risks? Breathing too quickly may cause dizziness or cause you to pass out. Take your time so you do not get dizzy or light-headed. If you are in pain, you may need to take pain medicine before doing incentive spirometry. It is harder to take a deep breath if you are having pain. How to use your incentive spirometer  Sit up on the edge of your bed or on a chair. Hold the incentive spirometer so that it is in an upright position. Before you use the spirometer, breathe out normally. Place the mouthpiece in your mouth. Make sure your lips are closed tightly around it. Breathe in slowly and as deeply as you can through your mouth, causing the piston or the ball to rise toward the top of the chamber. Hold your breath for 3-5 seconds, or for as long as possible. If the spirometer includes a coach indicator, use this to guide you in  breathing. Slow down your breathing if the indicator goes above the marked areas. Remove the mouthpiece from your mouth and breathe out normally. The piston or ball will return to the bottom of the chamber. Rest for a few seconds, then repeat the steps 10 or more times. Take your time and take a few normal breaths between deep breaths so that you do not get dizzy or light-headed. Do this every 1-2 hours when you are awake. If the spirometer includes a goal marker to show the highest number you have reached (best effort), use this as a goal to work toward during each repetition. After each set of 10 deep breaths, cough a few times. This will help to make sure that your lungs are clear. If you have an incision on your chest or abdomen from surgery, place a pillow or a rolled-up towel firmly against the incision when you cough. This can help to reduce pain while taking deep breaths and coughing. General tips When you are able to get out of bed: Walk around often. Continue to take deep breaths and cough in order to clear your lungs. Keep using the incentive spirometer until your health care provider says it is okay to stop using it. If you have been in the hospital, you may be told to keep using the spirometer at home. Contact a health care provider if: You are having difficulty using the spirometer. You have trouble using the spirometer as often as instructed. Your pain medicine is not giving enough relief for you to use the spirometer as told. You have a fever. Get help right away if: You develop shortness of breath. You develop a cough with bloody mucus from the lungs. You have fluid or blood coming from an incision site after you cough. Summary An incentive spirometer is a tool that can help you learn to take long, deep breaths to keep your lungs clear and active. You may  be asked to use a spirometer after a surgery, if you have a lung problem or a history of smoking, or if you have been  inactive for a long period of time. Use your incentive spirometer as instructed every 1-2 hours while you are awake. If you have an incision on your chest or abdomen, place a pillow or a rolled-up towel firmly against your incision when you cough. This will help to reduce pain. Get help right away if you have shortness of breath, you cough up bloody mucus, or blood comes from your incision when you cough. This information is not intended to replace advice given to you by your health care provider. Make sure you discuss any questions you have with your health care provider. Document Revised: 03/05/2020 Document Reviewed: 03/05/2020 Elsevier Patient Education  2023 [employer].

## 2024-09-06 ENCOUNTER — Encounter: Payer: Self-pay | Admitting: Urgent Care

## 2024-09-06 ENCOUNTER — Inpatient Hospital Stay: Admission: RE | Admit: 2024-09-06 | Source: Ambulatory Visit

## 2024-09-08 ENCOUNTER — Ambulatory Visit: Admission: RE | Admit: 2024-09-08 | Source: Home / Self Care | Admitting: Surgery

## 2024-09-08 ENCOUNTER — Encounter: Admission: RE | Payer: Self-pay | Source: Home / Self Care

## 2024-09-08 SURGERY — ARTHROSCOPY, SHOULDER WITH DEBRIDEMENT
Anesthesia: Choice | Site: Shoulder | Laterality: Right

## 2024-11-21 ENCOUNTER — Other Ambulatory Visit: Payer: Self-pay | Admitting: Nurse Practitioner

## 2024-11-21 DIAGNOSIS — K7460 Unspecified cirrhosis of liver: Secondary | ICD-10-CM

## 2024-12-26 ENCOUNTER — Ambulatory Visit
Admission: RE | Admit: 2024-12-26 | Discharge: 2024-12-26 | Disposition: A | Source: Ambulatory Visit | Attending: Nurse Practitioner

## 2024-12-26 DIAGNOSIS — K7460 Unspecified cirrhosis of liver: Secondary | ICD-10-CM | POA: Diagnosis present
